# Patient Record
Sex: Female | Born: 1950
Health system: Southern US, Community
[De-identification: ages and names within clinical notes are randomized; demographics above are authoritative.]

## PROBLEM LIST (undated history)

## (undated) DIAGNOSIS — N200 Calculus of kidney: Secondary | ICD-10-CM

## (undated) DIAGNOSIS — N183 Chronic kidney disease, stage 3 unspecified: Secondary | ICD-10-CM

## (undated) DIAGNOSIS — Z87442 Personal history of urinary calculi: Secondary | ICD-10-CM

## (undated) DIAGNOSIS — M1990 Unspecified osteoarthritis, unspecified site: Secondary | ICD-10-CM

## (undated) DIAGNOSIS — I471 Supraventricular tachycardia, unspecified: Secondary | ICD-10-CM

## (undated) DIAGNOSIS — Z8709 Personal history of other diseases of the respiratory system: Secondary | ICD-10-CM

## (undated) DIAGNOSIS — M549 Dorsalgia, unspecified: Secondary | ICD-10-CM

## (undated) DIAGNOSIS — H409 Unspecified glaucoma: Secondary | ICD-10-CM

## (undated) DIAGNOSIS — J45909 Unspecified asthma, uncomplicated: Secondary | ICD-10-CM

## (undated) DIAGNOSIS — N201 Calculus of ureter: Secondary | ICD-10-CM

## (undated) DIAGNOSIS — E119 Type 2 diabetes mellitus without complications: Secondary | ICD-10-CM

## (undated) DIAGNOSIS — R002 Palpitations: Secondary | ICD-10-CM

## (undated) DIAGNOSIS — E785 Hyperlipidemia, unspecified: Secondary | ICD-10-CM

## (undated) DIAGNOSIS — Z973 Presence of spectacles and contact lenses: Secondary | ICD-10-CM

## (undated) DIAGNOSIS — E559 Vitamin D deficiency, unspecified: Secondary | ICD-10-CM

## (undated) DIAGNOSIS — D509 Iron deficiency anemia, unspecified: Secondary | ICD-10-CM

## (undated) HISTORY — DX: Hyperlipidemia, unspecified: E78.5

## (undated) HISTORY — DX: Palpitations: R00.2

## (undated) HISTORY — PX: OTHER SURGICAL HISTORY: SHX169

## (undated) HISTORY — DX: Type 2 diabetes mellitus without complications: E11.9

## (undated) HISTORY — PX: CATARACT EXTRACTION W/ INTRAOCULAR LENS IMPLANT: SHX1309

## (undated) HISTORY — PX: CHOLECYSTECTOMY: SHX55

## (undated) HISTORY — DX: Vitamin D deficiency, unspecified: E55.9

## (undated) HISTORY — DX: Unspecified asthma, uncomplicated: J45.909

## (undated) HISTORY — DX: Unspecified glaucoma: H40.9

## (undated) HISTORY — DX: Dorsalgia, unspecified: M54.9

## (undated) HISTORY — DX: Unspecified osteoarthritis, unspecified site: M19.90

---

## 1997-10-16 HISTORY — PX: CHOLECYSTECTOMY, LAPAROSCOPIC: SHX56

## 1997-10-16 HISTORY — PX: CHOLECYSTECTOMY OPEN: SUR202

## 1998-06-07 ENCOUNTER — Observation Stay (HOSPITAL_COMMUNITY): Admission: RE | Admit: 1998-06-07 | Discharge: 1998-06-08 | Payer: Self-pay | Admitting: General Surgery

## 1999-05-31 ENCOUNTER — Other Ambulatory Visit: Admission: RE | Admit: 1999-05-31 | Discharge: 1999-05-31 | Payer: Self-pay | Admitting: *Deleted

## 2000-03-30 ENCOUNTER — Encounter: Payer: Self-pay | Admitting: *Deleted

## 2000-03-30 ENCOUNTER — Encounter: Admission: RE | Admit: 2000-03-30 | Discharge: 2000-03-30 | Payer: Self-pay | Admitting: *Deleted

## 2001-05-21 ENCOUNTER — Encounter: Admission: RE | Admit: 2001-05-21 | Discharge: 2001-05-21 | Payer: Self-pay | Admitting: *Deleted

## 2001-05-21 ENCOUNTER — Encounter: Payer: Self-pay | Admitting: *Deleted

## 2002-05-27 ENCOUNTER — Encounter: Admission: RE | Admit: 2002-05-27 | Discharge: 2002-05-27 | Payer: Self-pay | Admitting: *Deleted

## 2002-05-27 ENCOUNTER — Encounter: Payer: Self-pay | Admitting: *Deleted

## 2003-09-09 ENCOUNTER — Encounter: Admission: RE | Admit: 2003-09-09 | Discharge: 2003-12-08 | Payer: Self-pay | Admitting: *Deleted

## 2004-02-13 ENCOUNTER — Encounter: Admission: RE | Admit: 2004-02-13 | Discharge: 2004-02-13 | Payer: Self-pay | Admitting: *Deleted

## 2005-05-31 ENCOUNTER — Encounter: Admission: RE | Admit: 2005-05-31 | Discharge: 2005-05-31 | Payer: Self-pay | Admitting: *Deleted

## 2005-06-07 ENCOUNTER — Other Ambulatory Visit: Admission: RE | Admit: 2005-06-07 | Discharge: 2005-06-07 | Payer: Self-pay | Admitting: *Deleted

## 2006-07-18 ENCOUNTER — Encounter: Admission: RE | Admit: 2006-07-18 | Discharge: 2006-07-18 | Payer: Self-pay | Admitting: *Deleted

## 2006-09-07 ENCOUNTER — Other Ambulatory Visit: Admission: RE | Admit: 2006-09-07 | Discharge: 2006-09-07 | Payer: Self-pay | Admitting: *Deleted

## 2007-07-29 ENCOUNTER — Encounter: Admission: RE | Admit: 2007-07-29 | Discharge: 2007-07-29 | Payer: Self-pay | Admitting: *Deleted

## 2007-09-23 ENCOUNTER — Encounter: Admission: RE | Admit: 2007-09-23 | Discharge: 2007-09-23 | Payer: Self-pay | Admitting: Family Medicine

## 2007-11-14 ENCOUNTER — Emergency Department (HOSPITAL_COMMUNITY): Admission: EM | Admit: 2007-11-14 | Discharge: 2007-11-14 | Payer: Self-pay | Admitting: Emergency Medicine

## 2008-08-19 ENCOUNTER — Encounter: Admission: RE | Admit: 2008-08-19 | Discharge: 2008-08-19 | Payer: Self-pay | Admitting: Family Medicine

## 2009-07-27 ENCOUNTER — Other Ambulatory Visit: Admission: RE | Admit: 2009-07-27 | Discharge: 2009-07-27 | Payer: Self-pay | Admitting: Obstetrics and Gynecology

## 2009-08-27 ENCOUNTER — Encounter: Admission: RE | Admit: 2009-08-27 | Discharge: 2009-08-27 | Payer: Self-pay | Admitting: Family Medicine

## 2010-09-13 ENCOUNTER — Encounter: Admission: RE | Admit: 2010-09-13 | Discharge: 2010-09-13 | Payer: Self-pay | Admitting: Family Medicine

## 2010-10-16 HISTORY — PX: CATARACT EXTRACTION W/ INTRAOCULAR LENS IMPLANT: SHX1309

## 2011-04-12 ENCOUNTER — Other Ambulatory Visit: Payer: Self-pay | Admitting: Sports Medicine

## 2011-04-12 DIAGNOSIS — M25512 Pain in left shoulder: Secondary | ICD-10-CM

## 2011-04-14 ENCOUNTER — Other Ambulatory Visit: Payer: Self-pay

## 2011-04-17 ENCOUNTER — Ambulatory Visit
Admission: RE | Admit: 2011-04-17 | Discharge: 2011-04-17 | Disposition: A | Discharge status: Home / Self Care | Payer: Federal, State, Local not specified - PPO | Source: Ambulatory Visit | Attending: Sports Medicine | Admitting: Sports Medicine

## 2011-04-17 DIAGNOSIS — M25512 Pain in left shoulder: Secondary | ICD-10-CM

## 2011-07-06 LAB — BASIC METABOLIC PANEL
CO2: 30
Calcium: 9
Chloride: 103
Creatinine, Ser: 0.62

## 2011-08-21 ENCOUNTER — Other Ambulatory Visit: Payer: Self-pay | Admitting: Family Medicine

## 2011-08-21 DIAGNOSIS — Z1231 Encounter for screening mammogram for malignant neoplasm of breast: Secondary | ICD-10-CM

## 2011-09-20 ENCOUNTER — Ambulatory Visit
Admission: RE | Admit: 2011-09-20 | Discharge: 2011-09-20 | Disposition: A | Discharge status: Home / Self Care | Payer: Federal, State, Local not specified - PPO | Source: Ambulatory Visit | Attending: Family Medicine | Admitting: Family Medicine

## 2011-09-20 DIAGNOSIS — Z1231 Encounter for screening mammogram for malignant neoplasm of breast: Secondary | ICD-10-CM

## 2012-10-11 ENCOUNTER — Other Ambulatory Visit: Payer: Self-pay | Admitting: Family Medicine

## 2012-10-11 DIAGNOSIS — Z1231 Encounter for screening mammogram for malignant neoplasm of breast: Secondary | ICD-10-CM

## 2012-11-04 ENCOUNTER — Ambulatory Visit
Admission: RE | Admit: 2012-11-04 | Discharge: 2012-11-04 | Disposition: A | Discharge status: Home / Self Care | Payer: Federal, State, Local not specified - PPO | Source: Ambulatory Visit | Attending: Family Medicine | Admitting: Family Medicine

## 2012-11-04 DIAGNOSIS — Z1231 Encounter for screening mammogram for malignant neoplasm of breast: Secondary | ICD-10-CM

## 2013-04-21 ENCOUNTER — Telehealth: Payer: Self-pay | Admitting: Cardiovascular Disease

## 2013-04-21 NOTE — Telephone Encounter (Signed)
ROI faxed to Carolanne Grumbling at [phone] 04/21/13/km

## 2013-04-22 ENCOUNTER — Telehealth: Payer: Self-pay | Admitting: Cardiovascular Disease

## 2013-04-22 NOTE — Telephone Encounter (Signed)
Records Rec From Dr.Tracy Turner office gave to [facility] 04/22/13/KM

## 2013-04-25 ENCOUNTER — Encounter: Payer: Self-pay | Admitting: *Deleted

## 2013-04-25 ENCOUNTER — Encounter: Payer: Self-pay | Admitting: Cardiovascular Disease

## 2013-04-25 DIAGNOSIS — E559 Vitamin D deficiency, unspecified: Secondary | ICD-10-CM | POA: Insufficient documentation

## 2013-04-25 DIAGNOSIS — E119 Type 2 diabetes mellitus without complications: Secondary | ICD-10-CM | POA: Insufficient documentation

## 2013-04-25 DIAGNOSIS — R002 Palpitations: Secondary | ICD-10-CM | POA: Insufficient documentation

## 2013-04-25 DIAGNOSIS — H409 Unspecified glaucoma: Secondary | ICD-10-CM | POA: Insufficient documentation

## 2013-04-25 DIAGNOSIS — E785 Hyperlipidemia, unspecified: Secondary | ICD-10-CM | POA: Insufficient documentation

## 2013-04-25 DIAGNOSIS — M1990 Unspecified osteoarthritis, unspecified site: Secondary | ICD-10-CM | POA: Insufficient documentation

## 2013-04-25 DIAGNOSIS — J45909 Unspecified asthma, uncomplicated: Secondary | ICD-10-CM | POA: Insufficient documentation

## 2013-04-25 DIAGNOSIS — M549 Dorsalgia, unspecified: Secondary | ICD-10-CM | POA: Insufficient documentation

## 2013-04-28 ENCOUNTER — Ambulatory Visit (INDEPENDENT_AMBULATORY_CARE_PROVIDER_SITE_OTHER): Payer: Federal, State, Local not specified - PPO | Admitting: Cardiovascular Disease

## 2013-04-28 VITALS — BP 146/80 | HR 75 | Wt 206.0 lb

## 2013-04-28 DIAGNOSIS — E785 Hyperlipidemia, unspecified: Secondary | ICD-10-CM

## 2013-04-28 DIAGNOSIS — E119 Type 2 diabetes mellitus without complications: Secondary | ICD-10-CM

## 2013-04-28 DIAGNOSIS — R002 Palpitations: Secondary | ICD-10-CM

## 2013-04-28 DIAGNOSIS — R079 Chest pain, unspecified: Secondary | ICD-10-CM

## 2013-04-28 NOTE — Assessment & Plan Note (Signed)
Benign reviewed records from Dr Malachy Mood office Observe

## 2013-04-28 NOTE — Patient Instructions (Signed)
Your physician wants you to follow-up in:  6 MONTHS WITH DR NISHAN  You will receive a reminder letter in the mail two months in advance. If you don't receive a letter, please call our office to schedule the follow-up appointment. Your physician recommends that you continue on your current medications as directed. Please refer to the Current Medication list given to you today. 

## 2013-04-28 NOTE — Assessment & Plan Note (Signed)
Atypical Normal myovue 8/13  Tylenol or Motrin PRN no need for further testing.  ECG chronically abnormal Copy given to patient.  No change since myovue done 8/13

## 2013-04-28 NOTE — Assessment & Plan Note (Signed)
Cholesterol is at goal.  Continue current dose of statin and diet Rx.  No myalgias or side effects.  F/U  LFT's in 6 months. No results found for this basename: [facility]  Labs with Dr Zachery Dauer

## 2013-04-28 NOTE — Assessment & Plan Note (Signed)
Discussed low carb diet.  Target hemoglobin A1c is 6.5 or less.  Continue current medications.  

## 2013-04-28 NOTE — Progress Notes (Signed)
Patient ID: Tiffany Weeks, female   DOB: March 25, 1951, 62 y.o.   MRN: [id number] 62 yo referred by Dr Zachery Dauer  Previously seen by Dr Mayford Knife. Palpitations and atypical chest pain.  Had normal myovue 8/13  Event monitor with no arrhythmia.  Her current chest pain dates back to March Her husband has been ill and she leaned over to help him and strained her chest. Pain is non exertional.  Occasionally positional.  No pleuritic component Points to mid sternal area. Palpitatoins are better. Only occasionally at night with flip flops No exertonal symptoms. Getting back to golfing  CRF; DM on oral Rx HTN on ACE and elevated lipids on vytorin  ROS: Denies fever, malais, weight loss, blurry vision, decreased visual acuity, cough, sputum, SOB, hemoptysis, pleuritic pain, palpitaitons, heartburn, abdominal pain, melena, lower extremity edema, claudication, or rash.  All other systems reviewed and negative   General: Affect appropriate Healthy:  appears stated age HEENT: normal Neck supple with no adenopathy JVP normal no bruits no thyromegaly Lungs clear with no wheezing and good diaphragmatic motion Heart:  S1/S2 no murmur,rub, gallop or click PMI normal Abdomen: benighn, BS positve, no tenderness, no AAA no bruit.  No HSM or HJR Distal pulses intact with no bruits No edema Neuro non-focal Skin warm and dry No muscular weakness  Medications Current Outpatient Prescriptions  Medication Sig Dispense Refill  . aspirin 81 MG tablet Take 81 mg by mouth daily.      . bimatoprost (LUMIGAN) 0.03 % ophthalmic solution 1 drop at bedtime.      Marland Kitchen esomeprazole (NEXIUM) 40 MG capsule Take 40 mg by mouth daily before breakfast.      . ezetimibe-simvastatin (VYTORIN) 10-40 MG per tablet Take 1 tablet by mouth at bedtime.      . metFORMIN (GLUCOPHAGE) 500 MG tablet Take 500 mg by mouth 3 (three) times daily.      Marland Kitchen olmesartan (BENICAR) 40 MG tablet Take 20 mg by mouth daily.      . TRADJENTA 5 MG TABS tablet Take 1  tablet by mouth daily.       No current facility-administered medications for this visit.    Allergies Amoxicillin and Naprosyn  Family History: Family History  Problem Relation Age of Onset  . Liver disease    . Aortic stenosis    . Hyperlipidemia    . Thyroid disease    . Breast cancer      Social History: History   Social History  . Marital Status: Single    Spouse Name: N/A    Number of Children: N/A  . Years of Education: N/A   Occupational History  . Not on file.   Social History Main Topics  . Smoking status: Not on file  . Smokeless tobacco: Not on file  . Alcohol Use: Not on file  . Drug Use: Not on file  . Sexually Active: Not on file   Other Topics Concern  . Not on file   Social History Narrative  . No narrative on file    Electrocardiogram:  SR rate 75 LVH ? Lateral infarct LAD  No change from 05/17/11    Assessment and Plan

## 2013-09-10 ENCOUNTER — Other Ambulatory Visit: Payer: Self-pay | Admitting: Obstetrics and Gynecology

## 2013-09-10 ENCOUNTER — Other Ambulatory Visit (HOSPITAL_COMMUNITY)
Admission: RE | Admit: 2013-09-10 | Discharge: 2013-09-10 | Disposition: A | Discharge status: Home / Self Care | Payer: Federal, State, Local not specified - PPO | Source: Ambulatory Visit | Attending: Obstetrics and Gynecology | Admitting: Obstetrics and Gynecology

## 2013-09-10 DIAGNOSIS — Z01419 Encounter for gynecological examination (general) (routine) without abnormal findings: Secondary | ICD-10-CM | POA: Insufficient documentation

## 2013-09-10 DIAGNOSIS — Z1151 Encounter for screening for human papillomavirus (HPV): Secondary | ICD-10-CM | POA: Insufficient documentation

## 2013-10-01 ENCOUNTER — Other Ambulatory Visit: Payer: Self-pay

## 2013-10-01 DIAGNOSIS — Z1231 Encounter for screening mammogram for malignant neoplasm of breast: Secondary | ICD-10-CM

## 2013-10-27 ENCOUNTER — Encounter: Payer: Self-pay | Admitting: Cardiovascular Disease

## 2013-10-27 ENCOUNTER — Ambulatory Visit (INDEPENDENT_AMBULATORY_CARE_PROVIDER_SITE_OTHER): Payer: Federal, State, Local not specified - PPO | Admitting: Cardiovascular Disease

## 2013-10-27 VITALS — BP 175/76 | HR 102 | Ht 63.5 in | Wt 200.8 lb

## 2013-10-27 DIAGNOSIS — R9431 Abnormal electrocardiogram [ECG] [EKG]: Secondary | ICD-10-CM | POA: Insufficient documentation

## 2013-10-27 DIAGNOSIS — R079 Chest pain, unspecified: Secondary | ICD-10-CM

## 2013-10-27 DIAGNOSIS — E119 Type 2 diabetes mellitus without complications: Secondary | ICD-10-CM

## 2013-10-27 DIAGNOSIS — E785 Hyperlipidemia, unspecified: Secondary | ICD-10-CM

## 2013-10-27 DIAGNOSIS — R002 Palpitations: Secondary | ICD-10-CM

## 2013-10-27 DIAGNOSIS — I10 Essential (primary) hypertension: Secondary | ICD-10-CM

## 2013-10-27 NOTE — Assessment & Plan Note (Signed)
Discussed low carb diet.  Target hemoglobin A1c is 6.5 or less.  Continue current medications.  

## 2013-10-27 NOTE — Assessment & Plan Note (Signed)
Cholesterol is at goal.  Continue current dose of statin and diet Rx.  No myalgias or side effects.  F/U  LFT's in 6 months. No results found for this basename: [place] being followed by primary

## 2013-10-27 NOTE — Assessment & Plan Note (Signed)
Resolved atypical Normal myovue 2013  Follow

## 2013-10-27 NOTE — Assessment & Plan Note (Signed)
Resolved related to stress  No need for further w/u

## 2013-10-27 NOTE — Progress Notes (Signed)
Patient ID: Tiffany Weeks, female   DOB: 29-Nov-1950, 63 y.o.   MRN: [id number] 63 yo referred by Dr Tiffany Weeks 2013   Previously seen by Dr Tiffany Weeks. Palpitations and atypical chest pain. Had normal myovue 8/13 Event monitor with no arrhythmia. Her current chest pain dates back to March  she leaned over to help him and strained her chest. Pain is non exertional. Occasionally positional. No pleuritic component Points to mid sternal area. Palpitatoins are better. Only occasionally at night with flip flops No exertonal symptoms. Getting back to golfing CRF; DM on oral Rx HTN on ACE and elevated lipids on vytorin  Husband  Passed and service was Friday  Mixed emotions as he was suffering and a lot of work for her BP seems to be running higher     ROS: Denies fever, malais, weight loss, blurry vision, decreased visual acuity, cough, sputum, SOB, hemoptysis, pleuritic pain, palpitaitons, heartburn, abdominal pain, melena, lower extremity edema, claudication, or rash.  All other systems reviewed and negative  General: Affect appropriate Overweight white female  HEENT: normal Neck supple with no adenopathy JVP normal no bruits no thyromegaly Lungs clear with no wheezing and good diaphragmatic motion Heart:  S1/S2 no murmur, no rub, gallop or click PMI normal Abdomen: benighn, BS positve, no tenderness, no AAA no bruit.  No HSM or HJR Distal pulses intact with no bruits No edema Neuro non-focal Skin warm and dry No muscular weakness   Current Outpatient Prescriptions  Medication Sig Dispense Refill  . aspirin 81 MG tablet Take 81 mg by mouth daily.      . bimatoprost (LUMIGAN) 0.03 % ophthalmic solution 1 drop at bedtime.      . cholecalciferol (VITAMIN D) 1000 UNITS tablet Take 1,000 Units by mouth daily.      Marland Kitchen esomeprazole (NEXIUM) 40 MG capsule Take 40 mg by mouth daily before breakfast.      . ezetimibe-simvastatin (VYTORIN) 10-40 MG per tablet Take 1 tablet by mouth at bedtime.      .  metFORMIN (GLUCOPHAGE) 500 MG tablet Take 500 mg by mouth 3 (three) times daily.      Marland Kitchen olmesartan (BENICAR) 40 MG tablet Take 20 mg by mouth daily.      Vladimir Faster Glycol-Propyl Glycol (SYSTANE OP) Apply to eye daily.      . TRADJENTA 5 MG TABS tablet Take 1 tablet by mouth daily.       No current facility-administered medications for this visit.    Allergies  Align; Amoxicillin; Naprosyn; Tape; and Ciprofloxacin hcl  Electrocardiogram:  7/14  SR rate 75  LVH poor R wave progression   Assessment and Plan

## 2013-10-27 NOTE — Assessment & Plan Note (Signed)
I suspect she will need change in meds.  Showed her how to take BP  She has cuff at home  Will call us in a few weeks and if still running high will d/c benicar and change to Hyzaar 100/12.5

## 2013-10-27 NOTE — Patient Instructions (Signed)
Your physician wants you to follow-up in:   [place] will receive a reminder letter in the mail two months in advance. If you don't receive a letter, please call our office to schedule the follow-up appointment. Your physician recommends that you continue on your current medications as directed. Please refer to the Current Medication list given to you today.  PT  TO CALL BACK  NEXT WITH  B/P READINGS

## 2013-10-27 NOTE — Assessment & Plan Note (Signed)
Chronic related to LVH and body habitus  Patient has a copy of it

## 2013-11-06 ENCOUNTER — Ambulatory Visit
Admission: RE | Admit: 2013-11-06 | Discharge: 2013-11-06 | Disposition: A | Discharge status: Home / Self Care | Payer: Federal, State, Local not specified - PPO | Source: Ambulatory Visit

## 2013-11-06 DIAGNOSIS — Z1231 Encounter for screening mammogram for malignant neoplasm of breast: Secondary | ICD-10-CM

## 2013-12-08 ENCOUNTER — Telehealth: Payer: Self-pay | Admitting: *Deleted

## 2013-12-08 DIAGNOSIS — Z79899 Other long term (current) drug therapy: Secondary | ICD-10-CM

## 2013-12-08 NOTE — Telephone Encounter (Signed)
LEFT MESSAGE FOR PT TO CALL BACK  RE   B/P LOG  PER  DR NISHAN  STOP  BENICAR  AND  START  GEN HYZAAR  100/12.5 MG ./CY

## 2013-12-10 MED ORDER — LOSARTAN POTASSIUM-HCTZ 100-12.5 MG PO TABS: 1.0000 | ORAL_TABLET | Freq: Every day | ORAL | Status: DC

## 2013-12-10 NOTE — Telephone Encounter (Signed)
PT  AWARE  WILL LAB DONE  END   OF  MARCH./CY

## 2013-12-31 ENCOUNTER — Telehealth: Payer: Self-pay | Admitting: Cardiovascular Disease

## 2013-12-31 MED ORDER — LOSARTAN POTASSIUM-HCTZ 100-12.5 MG PO TABS: 1.0000 | ORAL_TABLET | Freq: Every day | ORAL | Status: DC

## 2013-12-31 NOTE — Telephone Encounter (Signed)
PT AWARE REFILL DONE .Tiffany Weeks

## 2013-12-31 NOTE — Telephone Encounter (Signed)
New problem       Pt needs a call back about her Medications please.  Pt reports no side effects from [place].  Pt would like a prescription called in to [facility] 90 day supplie.

## 2014-01-07 ENCOUNTER — Other Ambulatory Visit (INDEPENDENT_AMBULATORY_CARE_PROVIDER_SITE_OTHER): Payer: Federal, State, Local not specified - PPO

## 2014-01-07 DIAGNOSIS — Z79899 Other long term (current) drug therapy: Secondary | ICD-10-CM

## 2014-01-07 LAB — BASIC METABOLIC PANEL
BUN: 21 mg/dL (ref 6–23)
CALCIUM: 9.7 mg/dL (ref 8.4–10.5)
CHLORIDE: 99 meq/L (ref 96–112)
CO2: 30 meq/L (ref 19–32)
Creatinine, Ser: 0.9 mg/dL (ref 0.4–1.2)
GFR: 65.35 mL/min (ref >60.00)
GLUCOSE: 166 mg/dL — AB (ref 70–99)
Potassium: 3.8 mEq/L (ref 3.5–5.1)
SODIUM: 137 meq/L (ref 135–145)

## 2014-01-29 ENCOUNTER — Telehealth: Payer: Self-pay | Admitting: Cardiovascular Disease

## 2014-01-29 NOTE — Telephone Encounter (Signed)
Yes ok to stop hyzaar and increase benicar to 40 mg

## 2014-01-29 NOTE — Telephone Encounter (Signed)
New message     Pt has questions about bp medications and want lab results

## 2014-01-29 NOTE — Telephone Encounter (Signed)
Pt states since she started taking the medication Losartan-hydrochlorothiazide (Hyzaar) 100-12.5 mg one tablet daily, she has been nauseated every time she takes it. Pt would like to know if she can stop taking the Hyzaar medication and increase the  Benicar dose to 40 mg instead. Pt is taking now (20 mg)1/2 tablet Benicar daily . Pt states she  tolerates the benicar well. Pt is aware that this message will send to Md for recommendations.

## 2014-01-30 MED ORDER — OLMESARTAN MEDOXOMIL 40 MG PO TABS: ORAL_TABLET | ORAL | Status: DC

## 2014-01-30 NOTE — Telephone Encounter (Signed)
Called patient and advised per Dr.Nishan that she can stop Hyzaar and increase Benicar to 40mg  every day. She will check her BP a few times per week and call us with any issues.

## 2014-04-21 ENCOUNTER — Other Ambulatory Visit: Payer: Self-pay | Admitting: Gastroenterology

## 2014-06-04 ENCOUNTER — Other Ambulatory Visit (HOSPITAL_COMMUNITY): Payer: Self-pay | Admitting: Gastroenterology

## 2014-06-04 DIAGNOSIS — R140 Abdominal distension (gaseous): Secondary | ICD-10-CM

## 2014-06-09 ENCOUNTER — Ambulatory Visit (HOSPITAL_COMMUNITY)
Admission: RE | Admit: 2014-06-09 | Discharge: 2014-06-09 | Disposition: A | Discharge status: Home / Self Care | Payer: Federal, State, Local not specified - PPO | Source: Ambulatory Visit | Attending: Gastroenterology | Admitting: Gastroenterology

## 2014-06-09 DIAGNOSIS — R143 Flatulence: Principal | ICD-10-CM

## 2014-06-09 DIAGNOSIS — R142 Eructation: Secondary | ICD-10-CM | POA: Diagnosis not present

## 2014-06-09 DIAGNOSIS — R141 Gas pain: Secondary | ICD-10-CM | POA: Insufficient documentation

## 2014-06-09 DIAGNOSIS — R140 Abdominal distension (gaseous): Secondary | ICD-10-CM

## 2014-06-09 MED ORDER — TECHNETIUM TC 99M SULFUR COLLOID
2.0000 | Freq: Once | INTRAVENOUS | Status: AC | PRN
  Administered 2014-06-09: 2 via ORAL

## 2014-07-27 ENCOUNTER — Ambulatory Visit: Payer: Self-pay | Admitting: Podiatry

## 2014-07-29 ENCOUNTER — Ambulatory Visit (INDEPENDENT_AMBULATORY_CARE_PROVIDER_SITE_OTHER): Payer: Federal, State, Local not specified - PPO | Admitting: Podiatry

## 2014-07-29 ENCOUNTER — Encounter: Payer: Self-pay | Admitting: Podiatry

## 2014-07-29 ENCOUNTER — Ambulatory Visit (INDEPENDENT_AMBULATORY_CARE_PROVIDER_SITE_OTHER): Payer: Federal, State, Local not specified - PPO

## 2014-07-29 VITALS — BP 172/86 | HR 83 | Resp 16

## 2014-07-29 DIAGNOSIS — M779 Enthesopathy, unspecified: Secondary | ICD-10-CM

## 2014-07-29 DIAGNOSIS — M7660 Achilles tendinitis, unspecified leg: Secondary | ICD-10-CM

## 2014-07-29 MED ORDER — TRIAMCINOLONE ACETONIDE 10 MG/ML IJ SUSP
10.0000 mg | Freq: Once | INTRAMUSCULAR | Status: AC
Start: 2014-07-29 — End: 2014-07-29
  Administered 2014-07-29: 10 mg

## 2014-07-30 NOTE — Progress Notes (Signed)
Subjective:     Patient ID: Tiffany Weeks, female   DOB: [birth year], 63 y.o.   MRN: [id number]  Foot Pain   patient presents stating I'm getting discomfort around the bottom of my right first MPJ and it's making it hard for me to wear shoe gear comfortably. States that it occurred after activity   Review of Systems  All other systems reviewed and are negative.      Objective:   Physical Exam Neurovascular status intact muscle strength adequate with range of motion within normal limits and noted to have discomfort plantar aspect around the first metatarsal head that makes walking difficult with moderate discomfort around the Achilles tendon of both feet    Assessment:     Probable inflammatory capsulitis right secondary to excessive activity along with chronic tendinitis condition    Plan:     H&P and x-ray reviewed and careful injection administered to the painful area 3 mg Kenalog 5 mg Xylocaine along with padding to off load this painful area. Gave instructions on physical therapy discussed possibilities for orthotics and reappoint her recheck in the next several weeks

## 2014-08-05 ENCOUNTER — Ambulatory Visit (INDEPENDENT_AMBULATORY_CARE_PROVIDER_SITE_OTHER): Payer: Federal, State, Local not specified - PPO | Admitting: Podiatry

## 2014-08-05 ENCOUNTER — Encounter: Payer: Self-pay | Admitting: Podiatry

## 2014-08-05 VITALS — BP 177/78 | HR 86 | Resp 16

## 2014-08-05 DIAGNOSIS — M779 Enthesopathy, unspecified: Secondary | ICD-10-CM

## 2014-08-05 DIAGNOSIS — M7660 Achilles tendinitis, unspecified leg: Secondary | ICD-10-CM

## 2014-08-05 NOTE — Progress Notes (Signed)
Subjective:     Patient ID: Tiffany Weeks, female   DOB: May 15, 1951, 64 y.o.   MRN: [id number]  HPI patient states I'm having a little bit of pain but it is improved on my right foot   Review of Systems     Objective:   Physical Exam Neurovascular status intact with no change in health history and does have discomfort on the right plantar first metatarsal of a moderate nature when palpated    Assessment:     Inflammatory capsulitis with plantar flexed first metatarsal right foot    Plan:     Instructed patient on condition and recommended orthotics to dispense weight off the area. Patient wants orthotics and scanned for custom orthotics to reduce the weightbearing forces against the first metatarsal and will be seen back when ready

## 2014-08-28 ENCOUNTER — Other Ambulatory Visit: Payer: Federal, State, Local not specified - PPO

## 2014-09-04 ENCOUNTER — Other Ambulatory Visit: Payer: Federal, State, Local not specified - PPO

## 2014-09-04 ENCOUNTER — Ambulatory Visit (INDEPENDENT_AMBULATORY_CARE_PROVIDER_SITE_OTHER): Payer: Federal, State, Local not specified - PPO | Admitting: *Deleted

## 2014-09-04 DIAGNOSIS — M779 Enthesopathy, unspecified: Secondary | ICD-10-CM

## 2014-09-04 NOTE — Progress Notes (Signed)
Patient presents to pick up orthotics. Instructions were reviewed and a copy of those instructions were given to the patient. Patient to follow up as needed. 

## 2014-09-04 NOTE — Patient Instructions (Signed)

## 2014-09-14 ENCOUNTER — Other Ambulatory Visit: Payer: Self-pay | Admitting: Obstetrics and Gynecology

## 2014-09-14 ENCOUNTER — Other Ambulatory Visit (HOSPITAL_COMMUNITY)
Admission: RE | Admit: 2014-09-14 | Discharge: 2014-09-14 | Disposition: A | Discharge status: Home / Self Care | Payer: Federal, State, Local not specified - PPO | Source: Ambulatory Visit | Attending: Obstetrics and Gynecology | Admitting: Obstetrics and Gynecology

## 2014-09-14 DIAGNOSIS — N6324 Unspecified lump in the left breast, lower inner quadrant: Secondary | ICD-10-CM

## 2014-09-14 DIAGNOSIS — Z01419 Encounter for gynecological examination (general) (routine) without abnormal findings: Secondary | ICD-10-CM | POA: Insufficient documentation

## 2014-09-15 ENCOUNTER — Encounter: Payer: Federal, State, Local not specified - PPO | Attending: Family Medicine

## 2014-09-15 VITALS — Ht 63.0 in | Wt 199.5 lb

## 2014-09-15 DIAGNOSIS — E119 Type 2 diabetes mellitus without complications: Secondary | ICD-10-CM | POA: Insufficient documentation

## 2014-09-15 DIAGNOSIS — Z713 Dietary counseling and surveillance: Secondary | ICD-10-CM | POA: Insufficient documentation

## 2014-09-15 DIAGNOSIS — E118 Type 2 diabetes mellitus with unspecified complications: Secondary | ICD-10-CM

## 2014-09-15 LAB — CYTOLOGY - PAP

## 2014-09-18 NOTE — Progress Notes (Signed)

## 2014-09-25 ENCOUNTER — Ambulatory Visit
Admission: RE | Admit: 2014-09-25 | Discharge: 2014-09-25 | Disposition: A | Discharge status: Home / Self Care | Payer: Federal, State, Local not specified - PPO | Source: Ambulatory Visit | Attending: Obstetrics and Gynecology | Admitting: Obstetrics and Gynecology

## 2014-09-25 DIAGNOSIS — N6324 Unspecified lump in the left breast, lower inner quadrant: Secondary | ICD-10-CM

## 2014-09-29 DIAGNOSIS — E119 Type 2 diabetes mellitus without complications: Secondary | ICD-10-CM | POA: Diagnosis not present

## 2014-10-02 NOTE — Progress Notes (Signed)
Patient was seen on 09/29/14 for the third of a series of three diabetes self-management courses at the [facility]. The following learning objectives were met by the patient during this class:  . State the amount of activity recommended for healthy living . Describe activities suitable for individual needs . Identify ways to regularly incorporate activity into daily life . Identify barriers to activity and ways to over come these barriers  Identify diabetes medications being personally used and their primary action for lowering glucose and possible side effects . Describe role of stress on blood glucose and develop strategies to address psychosocial issues . Identify diabetes complications and ways to prevent them  Explain how to manage diabetes during illness . Evaluate success in meeting personal goal . Establish 2-3 goals that they will plan to diligently work on until they return for the  4-month follow-up visit  Goals:   I will be active 30 minutes or more 3 times a week  Your patient has identified these potential barriers to change:  Motivation Time  Your patient has identified their diabetes self-care support plan as  Family Support Plan:  Attend Core 4 in 4 months   

## 2014-10-23 ENCOUNTER — Other Ambulatory Visit: Payer: Self-pay

## 2014-10-23 DIAGNOSIS — Z1231 Encounter for screening mammogram for malignant neoplasm of breast: Secondary | ICD-10-CM

## 2014-10-27 NOTE — Progress Notes (Signed)
Patient ID: Tiffany Weeks, female   DOB: 1951/03/07, 64 y.o.   MRN: [id number] 64 y.o.  referred by Dr Drema Dallas 2013   Previously seen by Dr Radford Pax. Palpitations and atypical chest pain. Had normal myovue 8/13 Event monitor with no arrhythmia. Her current chest pain dates back to March  she leaned over to help him and strained her chest. Pain is non exertional. Occasionally positional. No pleuritic component Points to mid sternal area. Palpitatoins are better. Only occasionally at night with flip flops No exertonal symptoms. Getting back to golfing CRF; DM on oral Rx HTN on ACE and elevated lipids on vytorin  Husband  Passed last year   Mixed emotions as he was suffering and a lot of work for her BP seems to be running higher   Last visit tried hyzaar for BP control and patient had nausea  Benicar increased to 40 mg  Review of BP readings still high  She does not want to be on diuretic   Sees Dr Paulita Fujita for GERD EGD and motility study ok  Changing to zantac   ROS: Denies fever, malais, weight loss, blurry vision, decreased visual acuity, cough, sputum, SOB, hemoptysis, pleuritic pain, palpitaitons, heartburn, abdominal pain, melena, lower extremity edema, claudication, or rash.  All other systems reviewed and negative  General: Affect appropriate Overweight white female  HEENT: normal Neck supple with no adenopathy JVP normal no bruits no thyromegaly Lungs clear with no wheezing and good diaphragmatic motion Heart:  S1/S2 no murmur, no rub, gallop or click PMI normal Abdomen: benighn, BS positve, no tenderness, no AAA no bruit.  No HSM or HJR Distal pulses intact with no bruits No edema Neuro non-focal Skin warm and dry No muscular weakness   Current Outpatient Prescriptions  Medication Sig Dispense Refill  . aspirin 81 MG tablet Take 81 mg by mouth daily.    . belladonna-PHENObarbital (DONNATAL) 16.2 MG/5ML ELIX Take 5 mLs by mouth 3 (three) times daily.    . cholecalciferol (VITAMIN  D) 1000 UNITS tablet Take 1,000 Units by mouth daily.    Marland Kitchen esomeprazole (NEXIUM) 40 MG capsule Take 40 mg by mouth daily before breakfast.    . ezetimibe-simvastatin (VYTORIN) 10-40 MG per tablet Take 1 tablet by mouth at bedtime.    . metFORMIN (GLUCOPHAGE) 500 MG tablet Take 500 mg by mouth 3 (three) times daily.    Marland Kitchen olmesartan (BENICAR) 40 MG tablet Take 40mg  every day 90 tablet 3  . Polyethyl Glycol-Propyl Glycol (SYSTANE OP) Apply to eye daily.    . TRADJENTA 5 MG TABS tablet Take 1 tablet by mouth daily.     No current facility-administered medications for this visit.    Allergies  Align; Amoxicillin; Naprosyn; Tape; and Ciprofloxacin hcl  Electrocardiogram:  7/14  SR rate 75  LVH poor R wave progression  10/29/14  Sr rate 80 PVC    Assessment and Plan

## 2014-10-29 ENCOUNTER — Encounter: Payer: Self-pay | Admitting: Cardiovascular Disease

## 2014-10-29 ENCOUNTER — Ambulatory Visit (INDEPENDENT_AMBULATORY_CARE_PROVIDER_SITE_OTHER): Payer: Federal, State, Local not specified - PPO | Admitting: Cardiovascular Disease

## 2014-10-29 VITALS — BP 144/80 | HR 81 | Ht 63.0 in | Wt 195.0 lb

## 2014-10-29 DIAGNOSIS — I10 Essential (primary) hypertension: Secondary | ICD-10-CM

## 2014-10-29 DIAGNOSIS — E1040 Type 1 diabetes mellitus with diabetic neuropathy, unspecified: Secondary | ICD-10-CM

## 2014-10-29 DIAGNOSIS — E785 Hyperlipidemia, unspecified: Secondary | ICD-10-CM

## 2014-10-29 MED ORDER — AMLODIPINE BESYLATE 5 MG PO TABS: 5.0000 mg | ORAL_TABLET | Freq: Every evening | ORAL | Status: DC

## 2014-10-29 MED ORDER — OLMESARTAN MEDOXOMIL 40 MG PO TABS: ORAL_TABLET | ORAL | Status: DC

## 2014-10-29 NOTE — Assessment & Plan Note (Signed)
Cholesterol is at goal.  Continue current dose of statin and diet Rx.  No myalgias or side effects.  F/U  LFT's in 6 months. No results found for: LDLCALC           

## 2014-10-29 NOTE — Assessment & Plan Note (Signed)
Discussed low carb diet.  Target hemoglobin A1c is 6.5 or less.  Continue current medications.  

## 2014-10-29 NOTE — Patient Instructions (Signed)
START NORVASC 5 MG IN THE EVENING   Your physician wants you to follow-up in: 3 [place] will receive a reminder letter in the mail two months in advance. If you don't receive a letter, please call our office to schedule the follow-up appointment.

## 2014-10-29 NOTE — Assessment & Plan Note (Signed)
Suboptimal control.  Add norvasc 5 mg  F/u 3 months  Low sodium diet Take benicar in am and norvasc at supper

## 2014-11-12 ENCOUNTER — Ambulatory Visit
Admission: RE | Admit: 2014-11-12 | Discharge: 2014-11-12 | Disposition: A | Discharge status: Home / Self Care | Payer: Federal, State, Local not specified - PPO | Source: Ambulatory Visit

## 2014-11-12 DIAGNOSIS — Z1231 Encounter for screening mammogram for malignant neoplasm of breast: Secondary | ICD-10-CM

## 2015-01-19 NOTE — Progress Notes (Signed)
Patient ID: Tiffany Weeks, female   DOB: 1951-09-30, 64 y.o.   MRN: [id number] 64 y.o.  referred by Dr Drema Dallas 2013   Previously seen by Dr Radford Pax. Palpitations and atypical chest pain. Had normal myovue 8/13 Event monitor with no arrhythmia. Some left trapezius muscle soreness relieved with bioflex   DM well controlled.  Wanting to get back to water aerobics.    Palpitatoins are better. Only occasionally at night with flip flops No exertonal symptoms. Getting back to golfing CRF; DM on oral Rx HTN on ACE and elevated lipids on vytorin  Husband  Passed last year   Mixed emotions as he was suffering and a lot of work for her BP seems to be running higher   Last visit tried hyzaar for BP control and patient had nausea  Benicar increased to 40 mg   She does not want to be on diuretic  Reviewed home BP readings with norvasc and in normal range     Sees Dr Paulita Fujita for GERD EGD and motility study ok  Changing to zantac   ROS: Denies fever, malais, weight loss, blurry vision, decreased visual acuity, cough, sputum, SOB, hemoptysis, pleuritic pain, palpitaitons, heartburn, abdominal pain, melena, lower extremity edema, claudication, or rash.  All other systems reviewed and negative  General: Affect appropriate Overweight white female  HEENT: normal Neck supple with no adenopathy JVP normal no bruits no thyromegaly Lungs clear with no wheezing and good diaphragmatic motion Heart:  S1/S2 no murmur, no rub, gallop or click PMI normal Abdomen: benighn, BS positve, no tenderness, no AAA no bruit.  No HSM or HJR Distal pulses intact with no bruits No edema Neuro non-focal Skin warm and dry No muscular weakness   Current Outpatient Prescriptions  Medication Sig Dispense Refill  . amLODipine (NORVASC) 5 MG tablet Take 1 tablet (5 mg total) by mouth every evening. 30 tablet 5  . aspirin 81 MG tablet Take 81 mg by mouth daily.    . cholecalciferol (VITAMIN D) 1000 UNITS tablet Take 1,000 Units by  mouth daily.    Marland Kitchen ezetimibe-simvastatin (VYTORIN) 10-40 MG per tablet Take 1 tablet by mouth as directed. 1 TABLET EVERY 3 DAYS    . glimepiride (AMARYL) 1 MG tablet Take 1 tablet by mouth daily  0  . metFORMIN (GLUCOPHAGE) 500 MG tablet 3 (three) times daily. TAKE 1 1/2 tablet by mouth with breakfast,1 1 tablet by mouth in the afternoon and 1 1/2tablet by mouth at bedtime    . olmesartan (BENICAR) 40 MG tablet ONE TABLET EVERY MORNING 30 tablet 5  . Polyethyl Glycol-Propyl Glycol (SYSTANE OP) Take 1 or two drops in eyes once or twice a day    . ranitidine (ZANTAC) 150 MG tablet Take 150 mg by mouth 2 (two) times daily.     . TRADJENTA 5 MG TABS tablet Take 1 tablet by mouth daily.     No current facility-administered medications for this visit.    Allergies  Align; Amoxicillin; Naprosyn; Tape; and Ciprofloxacin hcl  Electrocardiogram:  7/14  SR rate 75  LVH poor R wave progression  10/29/13  SR rate 80 PVC    Assessment and Plan Chest Pain:  Resolved Muscular left shoulder pain.  Consider f/u stress testing next year given DM HTN:  Well controlled continue current meds DM:  Well controlled A1c with primary sees eye doctor and podiatrist regularly Palpitations resolved  No need for beta blocker

## 2015-01-20 ENCOUNTER — Encounter: Payer: Self-pay | Admitting: Cardiovascular Disease

## 2015-01-20 ENCOUNTER — Ambulatory Visit (INDEPENDENT_AMBULATORY_CARE_PROVIDER_SITE_OTHER): Payer: Federal, State, Local not specified - PPO | Admitting: Cardiovascular Disease

## 2015-01-20 VITALS — BP 148/60 | HR 85 | Ht 63.0 in | Wt 193.4 lb

## 2015-01-20 DIAGNOSIS — R079 Chest pain, unspecified: Secondary | ICD-10-CM | POA: Diagnosis not present

## 2015-01-20 NOTE — Patient Instructions (Signed)
Your physician wants you to follow-up in: YEAR WITH DR NISHAN  You will receive a reminder letter in the mail two months in advance. If you don't receive a letter, please call our office to schedule the follow-up appointment.  Your physician recommends that you continue on your current medications as directed. Please refer to the Current Medication list given to you today. 

## 2015-02-24 ENCOUNTER — Other Ambulatory Visit: Payer: Self-pay | Admitting: *Deleted

## 2015-02-24 MED ORDER — OLMESARTAN MEDOXOMIL 40 MG PO TABS: ORAL_TABLET | ORAL | Status: DC

## 2015-03-22 ENCOUNTER — Telehealth: Payer: Self-pay | Admitting: Cardiovascular Disease

## 2015-03-22 NOTE — Telephone Encounter (Signed)
New Message      Pt calling stating that her BP has been running low, this morning was BP 91/61 P 88. Pt wants to know if she should take her BP medication or not. Pt also states she has diarrhea and no appetite.  Please call back and advise.

## 2015-03-22 NOTE — Telephone Encounter (Signed)
Spoke  WITH PT  HAS  HAD  STOMACH BUG  FOR   ABOUT  WEEK   THAT  WAS  WHEN  B/P WAS  NOTED . B/P CURRENTLY   HAS BEEN RUNNING  90-92/59-60"S  WITHOUT  AMLODIPINE  OR  BENICAR  WILL CALL BACK  ONCE B/P STARTS TO  INCREASE  RIGHT  NOW PT IS  DRINKING  GATORADE AND  PEDI LITE PT NOT  TAKING MEDS.PT  STATES  STILL HAS  DIARRHEA  .   WILL FORWARD TO DR Johnsie Cancel FOR  REVIEW  .Adonis Housekeeper

## 2015-04-18 ENCOUNTER — Other Ambulatory Visit: Payer: Self-pay | Admitting: Cardiovascular Disease

## 2015-09-15 ENCOUNTER — Other Ambulatory Visit: Payer: Self-pay | Admitting: Obstetrics and Gynecology

## 2015-09-15 ENCOUNTER — Other Ambulatory Visit (HOSPITAL_COMMUNITY)
Admission: RE | Admit: 2015-09-15 | Discharge: 2015-09-15 | Disposition: A | Discharge status: Home / Self Care | Payer: Federal, State, Local not specified - PPO | Source: Ambulatory Visit | Attending: Obstetrics and Gynecology | Admitting: Obstetrics and Gynecology

## 2015-09-15 DIAGNOSIS — Z124 Encounter for screening for malignant neoplasm of cervix: Secondary | ICD-10-CM | POA: Insufficient documentation

## 2015-09-16 LAB — CYTOLOGY - PAP

## 2015-09-21 ENCOUNTER — Other Ambulatory Visit: Payer: Self-pay | Admitting: Gastroenterology

## 2015-11-02 ENCOUNTER — Other Ambulatory Visit: Payer: Self-pay

## 2015-11-02 DIAGNOSIS — Z1231 Encounter for screening mammogram for malignant neoplasm of breast: Secondary | ICD-10-CM

## 2015-11-09 ENCOUNTER — Other Ambulatory Visit: Payer: Self-pay | Admitting: Cardiovascular Disease

## 2015-11-17 ENCOUNTER — Ambulatory Visit
Admission: RE | Admit: 2015-11-17 | Discharge: 2015-11-17 | Disposition: A | Discharge status: Home / Self Care | Payer: Federal, State, Local not specified - PPO | Source: Ambulatory Visit

## 2015-11-17 DIAGNOSIS — Z1231 Encounter for screening mammogram for malignant neoplasm of breast: Secondary | ICD-10-CM

## 2016-01-31 NOTE — Progress Notes (Signed)
Patient ID: NATALYAH EGELAND, female   DOB: 1951-04-16, 65 y.o.   MRN: [id number] 65 y.o.  referred by Dr Drema Dallas 2013   Previously seen by Dr Radford Pax. Palpitations and atypical chest pain. Had normal myovue 8/13 Event monitor with no arrhythmia. Some left trapezius muscle soreness relieved with bioflex   DM well controlled.  Wanting to get back to water aerobics.    Palpitatoins are better. Only occasionally at night with flip flops No exertonal symptoms. Getting back to golfing CRF; DM on oral Rx HTN on ACE and elevated lipids on vytorin  Husband  Passed 2015   Mixed emotions as he was suffering and a lot of work for her BP seems to be running higher   Last visit tried hyzaar for BP control and patient had nausea  Benicar increased to 40 mg   She does not want to be on diuretic  Reviewed home BP readings with norvasc and in normal range     Sees Dr Paulita Fujita for GERD EGD and motility study ok  Changing to zantac Had black stools occasionally colonoscopy December normal   Has exertional dyspnea and "jabs" of SSCP with activity .  Nothing prolonged Not pleuritic not positional has had for a few months   ROS: Denies fever, malais, weight loss, blurry vision, decreased visual acuity, cough, sputum, SOB, hemoptysis, pleuritic pain, palpitaitons, heartburn, abdominal pain, melena, lower extremity edema, claudication, or rash.  All other systems reviewed and negative  General: Affect appropriate Overweight white female  HEENT: normal Neck supple with no adenopathy JVP normal no bruits no thyromegaly Lungs clear with no wheezing and good diaphragmatic motion Heart:  S1/S2 no murmur, no rub, gallop or click PMI normal Abdomen: benighn, BS positve, no tenderness, no AAA no bruit.  No HSM or HJR Distal pulses intact with no bruits No edema Neuro non-focal Skin warm and dry No muscular weakness   Current Outpatient Prescriptions  Medication Sig Dispense Refill  . amLODipine (NORVASC) 5 MG tablet  TAKE 1 TABLET BY MOUTH EVERY EVENING 30 tablet 2  . aspirin 81 MG tablet Take 81 mg by mouth daily.    . cholecalciferol (VITAMIN D) 1000 UNITS tablet Take 1,000 Units by mouth daily.    Marland Kitchen ezetimibe-simvastatin (VYTORIN) 10-40 MG per tablet Take 1 tablet by mouth as directed.     Marland Kitchen glimepiride (AMARYL) 1 MG tablet Take 1 tablet by mouth daily  0  . meloxicam (MOBIC) 15 MG tablet Take 15 mg by mouth as directed.    . metFORMIN (GLUCOPHAGE) 500 MG tablet Take 750 mg by mouth 2 (two) times daily with a meal.     . olmesartan (BENICAR) 40 MG tablet Take 40 mg by mouth daily.    Vladimir Faster Glycol-Propyl Glycol (SYSTANE OP) Place 1-2 drops into both eyes as directed.     . ranitidine (ZANTAC) 150 MG tablet Take 150 mg by mouth 2 (two) times daily.     . TRADJENTA 5 MG TABS tablet Take 1 tablet by mouth daily.     No current facility-administered medications for this visit.    Allergies  Align; Naprosyn; Amoxicillin; Ciprofloxacin hcl; and Tape  Electrocardiogram:  7/14  SR rate 75  LVH poor R wave progression  10/29/13  SR rate 80 PVC    02/01/16  SR rate 82  Poor R wave progression   Assessment and Plan Chest Pain:  Atypical but diabetic and ECG ? Anterior MI  F/u exercise myovue  HTN:  Well controlled continue current meds home readings reviewed and fine  DM:  Well controlled A1c with primary sees eye doctor and podiatrist regularly A1c with Dr Drema Dallas 5.9 Palpitations resolved  No need for beta blocker  Chol:  LDL under 100 takes vytorin 3x/week  GI:  Told to stop ASA if stools "black" and call Dr August Saucer

## 2016-02-01 ENCOUNTER — Encounter: Payer: Self-pay | Admitting: Cardiovascular Disease

## 2016-02-01 ENCOUNTER — Ambulatory Visit (INDEPENDENT_AMBULATORY_CARE_PROVIDER_SITE_OTHER): Payer: Federal, State, Local not specified - PPO | Admitting: Cardiovascular Disease

## 2016-02-01 ENCOUNTER — Other Ambulatory Visit: Payer: Self-pay | Admitting: Cardiovascular Disease

## 2016-02-01 VITALS — BP 150/82 | HR 90 | Ht 63.0 in | Wt 201.1 lb

## 2016-02-01 DIAGNOSIS — I10 Essential (primary) hypertension: Secondary | ICD-10-CM

## 2016-02-01 DIAGNOSIS — R0789 Other chest pain: Secondary | ICD-10-CM | POA: Diagnosis not present

## 2016-02-01 MED ORDER — OLMESARTAN MEDOXOMIL 40 MG PO TABS: 40.0000 mg | ORAL_TABLET | Freq: Every day | ORAL | Status: DC

## 2016-02-01 NOTE — Patient Instructions (Signed)
Medication Instructions:  Your physician recommends that you continue on your current medications as directed. Please refer to the Current Medication list given to you today.   Labwork: None ordered  Testing/Procedures: Your physician has requested that you have en exercise stress myoview. For further information please visit [email or web address]. Please follow instruction sheet, as given.   Follow-Up: Your physician wants you to follow-up in: 1 year with Dr.Nishan You will receive a reminder letter in the mail two months in advance. If you don't receive a letter, please call our office to schedule the follow-up appointment.   Any Other Special Instructions Will Be Listed Below (If Applicable).     If you need a refill on your cardiac medications before your next appointment, please call your pharmacy.

## 2016-02-02 ENCOUNTER — Telehealth (HOSPITAL_COMMUNITY): Payer: Self-pay | Admitting: *Deleted

## 2016-02-02 NOTE — Telephone Encounter (Signed)
Patient given detailed instructions per Myocardial Perfusion Study Information Sheet for the test on 02/08/16. Patient notified to arrive 15 minutes early and that it is imperative to arrive on time for appointment to keep from having the test rescheduled.  If you need to cancel or reschedule your appointment, please call the office within 24 hours of your appointment. Failure to do so may result in a cancellation of your appointment, and a $50 no show fee. Patient verbalized understanding.[place] J Willie Loy, RN   

## 2016-02-08 ENCOUNTER — Ambulatory Visit (HOSPITAL_COMMUNITY): Payer: Federal, State, Local not specified - PPO | Attending: Cardiology

## 2016-02-08 DIAGNOSIS — E119 Type 2 diabetes mellitus without complications: Secondary | ICD-10-CM | POA: Diagnosis not present

## 2016-02-08 DIAGNOSIS — R0789 Other chest pain: Secondary | ICD-10-CM | POA: Diagnosis not present

## 2016-02-08 DIAGNOSIS — R002 Palpitations: Secondary | ICD-10-CM | POA: Diagnosis not present

## 2016-02-08 DIAGNOSIS — I10 Essential (primary) hypertension: Secondary | ICD-10-CM | POA: Insufficient documentation

## 2016-02-08 DIAGNOSIS — R0609 Other forms of dyspnea: Secondary | ICD-10-CM | POA: Diagnosis not present

## 2016-02-08 MED ORDER — TECHNETIUM TC 99M SESTAMIBI GENERIC - CARDIOLITE
32.7000 | Freq: Once | INTRAVENOUS | Status: AC | PRN
  Administered 2016-02-08: 32.7 via INTRAVENOUS

## 2016-02-09 ENCOUNTER — Ambulatory Visit (HOSPITAL_COMMUNITY): Payer: Federal, State, Local not specified - PPO | Attending: Cardiovascular Disease

## 2016-02-09 LAB — MYOCARDIAL PERFUSION IMAGING
CHL CUP MPHR: 155 {beats}/min
CHL CUP NUCLEAR SRS: 1
CHL CUP RESTING HR STRESS: 78 {beats}/min
CSEPHR: 105 %
Estimated workload: 7 METS
Exercise duration (min): 6 min
Exercise duration (sec): 0 s
LVDIAVOL: 69 mL (ref 46–106)
LVSYSVOL: 26 mL
NUC STRESS TID: 1.02
Peak HR: 164 {beats}/min
RATE: 0.33
SDS: 5
SSS: 6

## 2016-02-09 MED ORDER — TECHNETIUM TC 99M SESTAMIBI GENERIC - CARDIOLITE
32.1000 | Freq: Once | INTRAVENOUS | Status: AC | PRN
  Administered 2016-02-09: 32.1 via INTRAVENOUS

## 2016-04-03 DIAGNOSIS — M19041 Primary osteoarthritis, right hand: Secondary | ICD-10-CM | POA: Insufficient documentation

## 2016-04-03 DIAGNOSIS — R52 Pain, unspecified: Secondary | ICD-10-CM | POA: Insufficient documentation

## 2016-09-13 ENCOUNTER — Encounter: Payer: Self-pay | Admitting: Cardiovascular Disease

## 2016-10-03 ENCOUNTER — Other Ambulatory Visit: Payer: Self-pay | Admitting: Obstetrics and Gynecology

## 2016-10-03 ENCOUNTER — Other Ambulatory Visit (HOSPITAL_COMMUNITY)
Admission: RE | Admit: 2016-10-03 | Discharge: 2016-10-03 | Disposition: A | Discharge status: Home / Self Care | Payer: Federal, State, Local not specified - PPO | Source: Ambulatory Visit | Attending: Obstetrics and Gynecology | Admitting: Obstetrics and Gynecology

## 2016-10-03 DIAGNOSIS — Z01419 Encounter for gynecological examination (general) (routine) without abnormal findings: Secondary | ICD-10-CM | POA: Diagnosis not present

## 2016-10-05 LAB — CYTOLOGY - PAP: DIAGNOSIS: NEGATIVE

## 2016-12-06 ENCOUNTER — Other Ambulatory Visit: Payer: Self-pay | Admitting: Family Medicine

## 2016-12-06 DIAGNOSIS — Z1231 Encounter for screening mammogram for malignant neoplasm of breast: Secondary | ICD-10-CM

## 2016-12-27 ENCOUNTER — Ambulatory Visit
Admission: RE | Admit: 2016-12-27 | Discharge: 2016-12-27 | Disposition: A | Discharge status: Home / Self Care | Payer: Federal, State, Local not specified - PPO | Source: Ambulatory Visit | Attending: Family Medicine | Admitting: Family Medicine

## 2016-12-27 DIAGNOSIS — Z1231 Encounter for screening mammogram for malignant neoplasm of breast: Secondary | ICD-10-CM

## 2017-01-18 ENCOUNTER — Encounter: Payer: Self-pay | Admitting: Cardiovascular Disease

## 2017-01-19 ENCOUNTER — Other Ambulatory Visit: Payer: Self-pay | Admitting: Cardiovascular Disease

## 2017-02-02 NOTE — Progress Notes (Signed)
Patient ID: Tiffany Weeks, female   DOB: 05-Dec-1950, 66 y.o.   MRN: [id number] 66 y.o.  referred by Dr Drema Dallas 2013   Previously seen by Dr Radford Pax. Palpitations and atypical chest pain. Had normal myovue 8/13 and 02/08/16  Event monitor with no arrhythmia. Some left trapezius muscle soreness relieved with bioflex   DM well controlled.  Wanting to get back to water aerobics.    Palpitatoins are better. Only occasionally at night with flip flops No exertonal symptoms. Getting back to golfing CRF; DM on oral Rx HTN on ACE and elevated lipids on vytorin  Husband  Passed 2015   Mixed emotions as he was suffering and a lot of work for her BP seems to be running higher    Reviewed home BP readings with norvasc and in normal range Gets nausea with diuretic     Sees Dr Paulita Fujita for GERD EGD and motility study ok  Changing to zantac Had black stools occasionally colonoscopy December normal     ROS: Denies fever, malais, weight loss, blurry vision, decreased visual acuity, cough, sputum, SOB, hemoptysis, pleuritic pain, palpitaitons, heartburn, abdominal pain, melena, lower extremity edema, claudication, or rash.  All other systems reviewed and negative  General: Affect appropriate Overweight white female  HEENT: normal Neck supple with no adenopathy JVP normal no bruits no thyromegaly Lungs clear with no wheezing and good diaphragmatic motion Heart:  S1/S2 no murmur, no rub, gallop or click PMI normal Abdomen: benighn, BS positve, no tenderness, no AAA no bruit.  No HSM or HJR Distal pulses intact with no bruits No edema Neuro non-focal Skin warm and dry No muscular weakness   Current Outpatient Prescriptions  Medication Sig Dispense Refill  . amLODipine (NORVASC) 5 MG tablet TAKE 1 TABLET BY MOUTH EVERY EVENING 30 tablet 2  . aspirin 81 MG tablet Take 81 mg by mouth daily.    . cholecalciferol (VITAMIN D) 1000 UNITS tablet Take 1,000 Units by mouth daily.    Marland Kitchen ezetimibe-simvastatin  (VYTORIN) 10-40 MG per tablet Take 1 tablet by mouth as directed.     Marland Kitchen glimepiride (AMARYL) 1 MG tablet Take 1 tablet by mouth daily  0  . metFORMIN (GLUCOPHAGE) 500 MG tablet Take 750 mg by mouth 2 (two) times daily with a meal.     . olmesartan (BENICAR) 40 MG tablet Take 1 tablet (40 mg total) by mouth daily. 90 tablet 3  . Polyethyl Glycol-Propyl Glycol (SYSTANE OP) Place 1-2 drops into both eyes as directed.     . ranitidine (ZANTAC) 150 MG tablet Take 150 mg by mouth 2 (two) times daily.     . TRADJENTA 5 MG TABS tablet Take 1 tablet by mouth daily.     No current facility-administered medications for this visit.     Allergies  Align [acidophilus]; Bifidobacterium; Losartan; Naprosyn [naproxen]; Amoxicillin; Ciprofloxacin hcl; and Tape  Electrocardiogram:  7/14  SR rate 75  LVH poor R wave progression  10/29/13  SR rate 80 PVC    02/01/16  SR rate 82  Poor R wave progression   Assessment and Plan Chest Pain:  Atypical  myovue 02/09/16 normal EF 63%  HTN:  Well controlled continue current meds home readings reviewed and fine  DM:  Well controlled A1c with primary sees eye doctor and podiatrist regularly A1c with Dr Drema Dallas 5.9 Palpitations resolved  No need for beta blocker  Chol:  LDL under 100 takes vytorin 3x/week  GI:  Told to stop ASA if stools "  black" and call Dr August Saucer

## 2017-02-05 ENCOUNTER — Ambulatory Visit (INDEPENDENT_AMBULATORY_CARE_PROVIDER_SITE_OTHER): Payer: Federal, State, Local not specified - PPO | Admitting: Cardiovascular Disease

## 2017-02-05 ENCOUNTER — Encounter: Payer: Self-pay | Admitting: Cardiovascular Disease

## 2017-02-05 VITALS — BP 136/60 | HR 86 | Ht 66.0 in | Wt 202.4 lb

## 2017-02-05 DIAGNOSIS — I10 Essential (primary) hypertension: Secondary | ICD-10-CM

## 2017-02-05 NOTE — Patient Instructions (Addendum)

## 2017-02-23 ENCOUNTER — Other Ambulatory Visit: Payer: Self-pay | Admitting: Cardiovascular Disease

## 2017-04-13 ENCOUNTER — Other Ambulatory Visit: Payer: Self-pay | Admitting: Cardiovascular Disease

## 2017-10-25 ENCOUNTER — Ambulatory Visit: Payer: Federal, State, Local not specified - PPO | Admitting: Podiatry

## 2017-10-25 ENCOUNTER — Other Ambulatory Visit: Payer: Self-pay | Admitting: Podiatry

## 2017-10-25 ENCOUNTER — Ambulatory Visit (INDEPENDENT_AMBULATORY_CARE_PROVIDER_SITE_OTHER): Payer: Federal, State, Local not specified - PPO

## 2017-10-25 ENCOUNTER — Encounter: Payer: Self-pay | Admitting: Podiatry

## 2017-10-25 DIAGNOSIS — M779 Enthesopathy, unspecified: Secondary | ICD-10-CM

## 2017-10-25 DIAGNOSIS — M25572 Pain in left ankle and joints of left foot: Secondary | ICD-10-CM

## 2017-10-25 DIAGNOSIS — M7662 Achilles tendinitis, left leg: Secondary | ICD-10-CM

## 2017-10-25 MED ORDER — TRIAMCINOLONE ACETONIDE 10 MG/ML IJ SUSP
10.0000 mg | Freq: Once | INTRAMUSCULAR | Status: AC
  Administered 2017-10-25: 10 mg

## 2017-10-25 NOTE — Patient Instructions (Signed)

## 2017-10-25 NOTE — Progress Notes (Signed)
Subjective:   Patient ID: Tiffany Weeks, female   DOB: 67 y.o.   MRN: [id number]   HPI Patient presents with severe discomfort in the back of the left heel stating is been hurting for several months and does not remember injury.  She is tried elevation and ice and patient currently does not smoke and likes to be active   Review of Systems  All other systems reviewed and are negative.       Objective:  Physical Exam  Constitutional: She appears well-developed and well-nourished.  Cardiovascular: Intact distal pulses.  Pulmonary/Chest: Effort normal.  Musculoskeletal: Normal range of motion.  Neurological: She is alert.  Skin: Skin is warm.  Nursing note and vitals reviewed.   Neurovascular status found to be intact muscle strength adequate range of motion within normal limits with exquisite discomfort in the left posterior lateral aspect of the heel with inflammation fluid around the insertion of the Achilles tendon.  Patient is noted to have good digital perfusion and is well oriented x3 with mild equinus condition     Assessment:  Acute Achilles tendinitis left lateral side     Plan:  H&P condition reviewed and discussed careful injection discussing possibilities for rupture.  Patient wants this done I have also recommended immobilization I carefully injected the lateral side 3 mg Dexasone Kenalog and applied air fracture walker to completely immobilize.  Advised on ice and reappoint 3 weeks for reevaluation  X-rays indicate severe thick spur formation of the posterior aspect of the left heel

## 2017-11-19 ENCOUNTER — Ambulatory Visit: Payer: Federal, State, Local not specified - PPO | Admitting: Podiatry

## 2017-11-19 ENCOUNTER — Encounter: Payer: Self-pay | Admitting: Podiatry

## 2017-11-19 DIAGNOSIS — M7662 Achilles tendinitis, left leg: Secondary | ICD-10-CM | POA: Diagnosis not present

## 2017-11-21 NOTE — Progress Notes (Signed)
Subjective:   Patient ID: Tiffany Weeks, female   DOB: 67 y.o.   MRN: [id number]   HPI Patient presents stating the heel is doing a lot better with minimal discomfort and able to walk without pain   ROS      Objective:  Physical Exam  Neurovascular status intact with inflammation of the posterior heel left that is improved quite dramatically with minimal discomfort with palpation     Assessment:  Doing well post Achilles tendon inflammation left     Plan:  H&P conditions reviewed and at this time I recommended physical therapy and stretching exercises and dispensed a silicone ankle compression stocking to reduce posterior pain.  Reappoint as symptoms indicate

## 2017-11-27 ENCOUNTER — Other Ambulatory Visit: Payer: Self-pay | Admitting: Family Medicine

## 2017-11-27 DIAGNOSIS — Z1231 Encounter for screening mammogram for malignant neoplasm of breast: Secondary | ICD-10-CM

## 2017-12-31 ENCOUNTER — Ambulatory Visit
Admission: RE | Admit: 2017-12-31 | Discharge: 2017-12-31 | Disposition: A | Discharge status: Home / Self Care | Payer: Federal, State, Local not specified - PPO | Source: Ambulatory Visit | Attending: Family Medicine | Admitting: Family Medicine

## 2017-12-31 DIAGNOSIS — Z1231 Encounter for screening mammogram for malignant neoplasm of breast: Secondary | ICD-10-CM

## 2018-01-02 ENCOUNTER — Ambulatory Visit (INDEPENDENT_AMBULATORY_CARE_PROVIDER_SITE_OTHER): Payer: Medicare Other | Admitting: Podiatry

## 2018-01-02 ENCOUNTER — Encounter: Payer: Self-pay | Admitting: Podiatry

## 2018-01-02 DIAGNOSIS — M25572 Pain in left ankle and joints of left foot: Secondary | ICD-10-CM | POA: Diagnosis not present

## 2018-01-02 DIAGNOSIS — M7662 Achilles tendinitis, left leg: Secondary | ICD-10-CM | POA: Diagnosis not present

## 2018-01-02 NOTE — Progress Notes (Signed)
Subjective:   Patient ID: Tiffany Weeks, female   DOB: 67 y.o.   MRN: [id number]   HPI Patient states the pain has come back quite intensely in the left posterior heel and she thinks she needs something more aggressive done with it   ROS      Objective:  Physical Exam  Neurovascular status intact with exquisite discomfort posterior lateral aspect left heel at the insertion of the tendon into the calcaneus with inflammation fluid buildup noted and slight abrasion which may have been from the boot or other modality     Assessment:  Probability that this is a chronic acute inflammation of the Achilles tendon left lateral side     Plan:  I reviewed condition and I do not recommend further injections due to the fact we have done those in the past and I do think it would be best to go ahead and utilize shockwave therapy.  I educated her on shockwave and I applied padding to the back of the heel and I want her to use that for the next couple weeks and she will have shockwave performed in the next 2 weeks and I did discuss procedure and risk

## 2018-01-06 ENCOUNTER — Other Ambulatory Visit: Payer: Self-pay | Admitting: Cardiovascular Disease

## 2018-01-07 DIAGNOSIS — E119 Type 2 diabetes mellitus without complications: Secondary | ICD-10-CM | POA: Diagnosis not present

## 2018-01-07 DIAGNOSIS — H40013 Open angle with borderline findings, low risk, bilateral: Secondary | ICD-10-CM | POA: Diagnosis not present

## 2018-01-07 DIAGNOSIS — H1859 Other hereditary corneal dystrophies: Secondary | ICD-10-CM | POA: Diagnosis not present

## 2018-01-16 ENCOUNTER — Other Ambulatory Visit: Payer: Federal, State, Local not specified - PPO

## 2018-01-22 ENCOUNTER — Ambulatory Visit: Payer: Self-pay

## 2018-01-22 ENCOUNTER — Encounter: Payer: Self-pay | Admitting: Cardiovascular Disease

## 2018-01-22 DIAGNOSIS — M79676 Pain in unspecified toe(s): Secondary | ICD-10-CM

## 2018-01-22 DIAGNOSIS — M7662 Achilles tendinitis, left leg: Secondary | ICD-10-CM

## 2018-01-28 NOTE — Progress Notes (Signed)
Patient presents with ongoing pain in her lt heel. She said she has tried various treatments to relieve pain but nothing has helped.   Tenderness on palpation to Lt posterior heel  ESWT administered to area for 3 joules and tolerated well. Advised on avoidance of NSAIDs and ice. She is to follow up in 1 week for 2nd treatment

## 2018-01-29 ENCOUNTER — Ambulatory Visit: Payer: Self-pay

## 2018-01-29 DIAGNOSIS — M79676 Pain in unspecified toe(s): Secondary | ICD-10-CM

## 2018-01-29 DIAGNOSIS — M7662 Achilles tendinitis, left leg: Secondary | ICD-10-CM

## 2018-02-01 ENCOUNTER — Other Ambulatory Visit: Payer: Self-pay | Admitting: Cardiovascular Disease

## 2018-02-04 NOTE — Progress Notes (Signed)
Patient presents with ongoing pain in her lt heel. She said she has tried various treatments to relieve pain but nothing has helped.   Tenderness on palpation to Lt posterior heel  ESWT administered to area for 3.5 joules and tolerated well. Advised on avoidance of NSAIDs and ice. She is to follow up in 1 week for 3rd treatment

## 2018-02-12 DIAGNOSIS — W57XXXA Bitten or stung by nonvenomous insect and other nonvenomous arthropods, initial encounter: Secondary | ICD-10-CM | POA: Diagnosis not present

## 2018-02-12 DIAGNOSIS — S40262A Insect bite (nonvenomous) of left shoulder, initial encounter: Secondary | ICD-10-CM | POA: Diagnosis not present

## 2018-02-13 ENCOUNTER — Ambulatory Visit: Payer: Federal, State, Local not specified - PPO

## 2018-02-13 ENCOUNTER — Ambulatory Visit: Payer: Federal, State, Local not specified - PPO | Admitting: Cardiovascular Disease

## 2018-02-13 DIAGNOSIS — M7662 Achilles tendinitis, left leg: Secondary | ICD-10-CM

## 2018-02-13 DIAGNOSIS — M79676 Pain in unspecified toe(s): Secondary | ICD-10-CM

## 2018-02-14 ENCOUNTER — Other Ambulatory Visit: Payer: Self-pay | Admitting: Cardiovascular Disease

## 2018-02-14 ENCOUNTER — Telehealth: Payer: Self-pay | Admitting: Cardiovascular Disease

## 2018-02-14 MED ORDER — OLMESARTAN MEDOXOMIL 40 MG PO TABS: 40.0000 mg | ORAL_TABLET | Freq: Every day | ORAL | 0 refills | Status: DC

## 2018-02-14 NOTE — Telephone Encounter (Signed)
[facility] pharmacy faxed over an alternative therapy request for Olmesartan 40 mg. Pharmacy stated that Olmesartan 40 mg tablet is mfr backordered, this medication is not available. Please provide a new Rx, FYI all strengths of Losartan are available. Please address

## 2018-02-14 NOTE — Telephone Encounter (Signed)
Cozaar 50 mg ok to substitute

## 2018-02-15 MED ORDER — OLMESARTAN MEDOXOMIL 40 MG PO TABS: 40.0000 mg | ORAL_TABLET | Freq: Every day | ORAL | 3 refills | Status: AC

## 2018-02-15 NOTE — Telephone Encounter (Signed)
Called patient about Olmesartan (Benicar) backorder. Patient has an allergy to Losartan, patient stated she gets nauseated when she takes Losartan. Patient requested to see if local pharmacy had Olmesartan. [facility] [place], [facility]. Patient will pick up [facility].

## 2018-02-18 NOTE — Progress Notes (Signed)
Patient ID: Tiffany Weeks, female   DOB: 09-18-1951, 67 y.o.   MRN: [id number]   66 y.o. history of atypical chest pain and palpitations. Normal myovue in 2013 and 2017. Event monitor with no arrhythmias. CRF;s HTN, HLD and DM-2 Has had nausea with diuretic and cozaar in past. Widowed in 2015     Sees Dr Paulita Fujita for GERD EGD and motility study ok  Changing to zantac Had black stools occasionally colonoscopy December normal   Having chronic left heal pain Received ESWT Rx 01/29/18   Main complaint is fatigue A1c over 7 and has too many carbs Does swim at [facility] 2x/week encouraged her to increase this   ROS: Denies fever, malais, weight loss, blurry vision, decreased visual acuity, cough, sputum, SOB, hemoptysis, pleuritic pain, palpitaitons, heartburn, abdominal pain, melena, lower extremity edema, claudication, or rash.  All other systems reviewed and negative  General: BP 140/80   Pulse 81   Ht 5\' 6"  (1.676 m)   Wt 199 lb 12 oz (90.6 kg)   SpO2 98%   BMI 32.24 kg/m  Affect appropriate Overweight white female  HEENT: normal Neck supple with no adenopathy JVP normal no bruits no thyromegaly Lungs clear with no wheezing and good diaphragmatic motion Heart:  S1/S2 no murmur, no rub, gallop or click PMI normal Abdomen: benighn, BS positve, no tenderness, no AAA no bruit.  No HSM or HJR Distal pulses intact with no bruits No edema Neuro non-focal Skin warm and dry No muscular weakness    Current Outpatient Medications  Medication Sig Dispense Refill  . amLODipine (NORVASC) 5 MG tablet TAKE 1 TABLET BY MOUTH EVERY EVENING. PLEASE KEEP UPCOMING APPT FOR FUTURE REFILLS. THANK YOU 30 tablet 0  . aspirin 81 MG tablet Take 81 mg by mouth daily.    . cholecalciferol (VITAMIN D) 1000 UNITS tablet Take 1,000 Units by mouth daily.    Marland Kitchen ezetimibe-simvastatin (VYTORIN) 10-40 MG per tablet Take 1 tablet by mouth as directed.     Marland Kitchen glimepiride (AMARYL) 1 MG tablet Take 1 tablet by mouth  daily  0  . metFORMIN (GLUCOPHAGE) 500 MG tablet Take 750 mg by mouth 2 (two) times daily with a meal.     . olmesartan (BENICAR) 40 MG tablet Take 1 tablet (40 mg total) by mouth daily. Please keep upcoming appt for future refills. Thank you 90 tablet 3  . omeprazole (PRILOSEC) 40 MG capsule Take 40 mg by mouth as directed.    Vladimir Faster Glycol-Propyl Glycol (SYSTANE OP) Place 1-2 drops into both eyes as directed.     . TRADJENTA 5 MG TABS tablet Take 1 tablet by mouth daily.     No current facility-administered medications for this visit.     Allergies  Align [acidophilus]; Bifidobacterium; Losartan; Naprosyn [naproxen]; Amoxicillin; Ciprofloxacin hcl; and Tape  Electrocardiogram:  7/14  SR rate 75  LVH poor R wave progression  10/29/13  SR rate 80 PVC    02/01/16  SR rate 82  Poor R wave progression   Assessment and Plan Chest Pain:  Atypical  myovue 02/09/16 normal EF 63%  HTN:  Well controlled continue current meds home readings reviewed and fine  DM:  Well controlled A1c with primary sees eye doctor and podiatrist regularly A1c with Dr Drema Dallas 5.9 Palpitations resolved  No need for beta blocker  Chol:  LDL under 100 takes vytorin 3x/week  GI:  Told to stop ASA if stools "black" and call Dr Paulita Fujita   Ortho:  Heel pain f/u PT/OT had pulsed shockwave Rx 01/29/18    Will see patient as needed BP meds refilled f/u Dr Drema Dallas primary   Jenkins Rouge

## 2018-02-22 ENCOUNTER — Encounter: Payer: Self-pay | Admitting: Cardiovascular Disease

## 2018-02-22 ENCOUNTER — Ambulatory Visit (INDEPENDENT_AMBULATORY_CARE_PROVIDER_SITE_OTHER): Payer: Medicare Other | Admitting: Cardiovascular Disease

## 2018-02-22 VITALS — BP 140/80 | HR 81 | Ht 66.0 in | Wt 199.8 lb

## 2018-02-22 DIAGNOSIS — I10 Essential (primary) hypertension: Secondary | ICD-10-CM

## 2018-02-22 MED ORDER — AMLODIPINE BESYLATE 5 MG PO TABS: ORAL_TABLET | ORAL | 3 refills | Status: DC

## 2018-02-22 NOTE — Patient Instructions (Signed)
Medication Instructions:  Your physician recommends that you continue on your current medications as directed. Please refer to the Current Medication list given to you today.  Labwork: NONE  Testing/Procedures: NONE  Follow-Up: Your physician wants you to follow-up in: as needed with Dr. Johnsie Cancel.   If you need a refill on your cardiac medications before your next appointment, please call your pharmacy.

## 2018-03-04 DIAGNOSIS — M6740 Ganglion, unspecified site: Secondary | ICD-10-CM | POA: Diagnosis not present

## 2018-03-05 NOTE — Progress Notes (Signed)
Patient presents with ongoing pain in her lt heel. She said she has tried various treatments to relieve pain but nothing has helped.   Tenderness on palpation to Lt posterior heel  ESWT administered to area for 4 joules and tolerated well. Advised on avoidance of NSAIDs and ice. She is to follow up in 4 weeks for 4th treatment

## 2018-03-15 ENCOUNTER — Ambulatory Visit (INDEPENDENT_AMBULATORY_CARE_PROVIDER_SITE_OTHER): Payer: Medicare Other | Admitting: Podiatry

## 2018-03-15 ENCOUNTER — Ambulatory Visit: Payer: Federal, State, Local not specified - PPO

## 2018-03-15 ENCOUNTER — Encounter: Payer: Self-pay | Admitting: Podiatry

## 2018-03-15 DIAGNOSIS — M7662 Achilles tendinitis, left leg: Secondary | ICD-10-CM | POA: Diagnosis not present

## 2018-03-18 ENCOUNTER — Other Ambulatory Visit: Payer: Federal, State, Local not specified - PPO

## 2018-03-18 NOTE — Progress Notes (Signed)
Subjective:   Patient ID: Tiffany Weeks, female   DOB: 67 y.o.   MRN: [id number]   HPI Patient presents stating I am still having a lot of problems with my Achilles and the shockwave helped some but the pain is still back   ROS      Objective:  Physical Exam  Neurovascular status intact with discomfort in the posterior aspect of the heel at the insertion of the tendon into the calcaneus     Assessment:  Chronic Achilles tendinitis     Plan:  Reviewed condition we will try to hold off currently on shockwave but I recommended continued treatment and I am referring for physical therapy to try to provide for better support therapy and to hopefully prevent her from needing open cutting surgery.  May still requiring the

## 2018-03-19 ENCOUNTER — Telehealth: Payer: Self-pay | Admitting: *Deleted

## 2018-03-19 DIAGNOSIS — M7662 Achilles tendinitis, left leg: Secondary | ICD-10-CM

## 2018-03-19 NOTE — Telephone Encounter (Signed)
Hand delivered required [employer] form.

## 2018-03-21 ENCOUNTER — Telehealth: Payer: Self-pay | Admitting: *Deleted

## 2018-03-21 DIAGNOSIS — M25572 Pain in left ankle and joints of left foot: Secondary | ICD-10-CM | POA: Diagnosis not present

## 2018-03-21 DIAGNOSIS — M25672 Stiffness of left ankle, not elsewhere classified: Secondary | ICD-10-CM | POA: Diagnosis not present

## 2018-03-21 DIAGNOSIS — R269 Unspecified abnormalities of gait and mobility: Secondary | ICD-10-CM | POA: Diagnosis not present

## 2018-03-21 DIAGNOSIS — M25472 Effusion, left ankle: Secondary | ICD-10-CM | POA: Diagnosis not present

## 2018-03-21 MED ORDER — NONFORMULARY OR COMPOUNDED ITEM: 11 refills | Status: DC

## 2018-03-21 NOTE — Telephone Encounter (Signed)
I informed pt Tiffany Weeks would take care of the Dexamethasone liquid solution for her PT and gave the address and phone number. Orders called to [facility].

## 2018-03-21 NOTE — Telephone Encounter (Signed)
[facility] states pt needs Dexamethasone liquid solution 0.4% 16 oz.

## 2018-03-21 NOTE — Telephone Encounter (Signed)
[facility] states they can no long make the Dexamethasone liquid solution, suggested [facility] [phone] or [facility] [phone].

## 2018-03-21 NOTE — Telephone Encounter (Signed)
[facility] states they do make Dexamethasone 0.4% liquid solution and it is stable for 30 days.

## 2018-03-26 DIAGNOSIS — M25472 Effusion, left ankle: Secondary | ICD-10-CM | POA: Diagnosis not present

## 2018-03-26 DIAGNOSIS — M25572 Pain in left ankle and joints of left foot: Secondary | ICD-10-CM | POA: Diagnosis not present

## 2018-03-26 DIAGNOSIS — M25672 Stiffness of left ankle, not elsewhere classified: Secondary | ICD-10-CM | POA: Diagnosis not present

## 2018-03-26 DIAGNOSIS — R269 Unspecified abnormalities of gait and mobility: Secondary | ICD-10-CM | POA: Diagnosis not present

## 2018-03-29 DIAGNOSIS — R269 Unspecified abnormalities of gait and mobility: Secondary | ICD-10-CM | POA: Diagnosis not present

## 2018-03-29 DIAGNOSIS — M25672 Stiffness of left ankle, not elsewhere classified: Secondary | ICD-10-CM | POA: Diagnosis not present

## 2018-03-29 DIAGNOSIS — M25572 Pain in left ankle and joints of left foot: Secondary | ICD-10-CM | POA: Diagnosis not present

## 2018-03-29 DIAGNOSIS — M25472 Effusion, left ankle: Secondary | ICD-10-CM | POA: Diagnosis not present

## 2018-04-02 DIAGNOSIS — M25672 Stiffness of left ankle, not elsewhere classified: Secondary | ICD-10-CM | POA: Diagnosis not present

## 2018-04-02 DIAGNOSIS — M25572 Pain in left ankle and joints of left foot: Secondary | ICD-10-CM | POA: Diagnosis not present

## 2018-04-02 DIAGNOSIS — R269 Unspecified abnormalities of gait and mobility: Secondary | ICD-10-CM | POA: Diagnosis not present

## 2018-04-02 DIAGNOSIS — M25472 Effusion, left ankle: Secondary | ICD-10-CM | POA: Diagnosis not present

## 2018-04-04 DIAGNOSIS — R269 Unspecified abnormalities of gait and mobility: Secondary | ICD-10-CM | POA: Diagnosis not present

## 2018-04-04 DIAGNOSIS — M25472 Effusion, left ankle: Secondary | ICD-10-CM | POA: Diagnosis not present

## 2018-04-04 DIAGNOSIS — M25672 Stiffness of left ankle, not elsewhere classified: Secondary | ICD-10-CM | POA: Diagnosis not present

## 2018-04-04 DIAGNOSIS — M25572 Pain in left ankle and joints of left foot: Secondary | ICD-10-CM | POA: Diagnosis not present

## 2018-04-08 DIAGNOSIS — M25572 Pain in left ankle and joints of left foot: Secondary | ICD-10-CM | POA: Diagnosis not present

## 2018-04-08 DIAGNOSIS — R269 Unspecified abnormalities of gait and mobility: Secondary | ICD-10-CM | POA: Diagnosis not present

## 2018-04-08 DIAGNOSIS — M25672 Stiffness of left ankle, not elsewhere classified: Secondary | ICD-10-CM | POA: Diagnosis not present

## 2018-04-08 DIAGNOSIS — M25472 Effusion, left ankle: Secondary | ICD-10-CM | POA: Diagnosis not present

## 2018-04-11 DIAGNOSIS — M25472 Effusion, left ankle: Secondary | ICD-10-CM | POA: Diagnosis not present

## 2018-04-11 DIAGNOSIS — R269 Unspecified abnormalities of gait and mobility: Secondary | ICD-10-CM | POA: Diagnosis not present

## 2018-04-11 DIAGNOSIS — M25672 Stiffness of left ankle, not elsewhere classified: Secondary | ICD-10-CM | POA: Diagnosis not present

## 2018-04-11 DIAGNOSIS — M25572 Pain in left ankle and joints of left foot: Secondary | ICD-10-CM | POA: Diagnosis not present

## 2018-04-15 DIAGNOSIS — M25472 Effusion, left ankle: Secondary | ICD-10-CM | POA: Diagnosis not present

## 2018-04-15 DIAGNOSIS — M25572 Pain in left ankle and joints of left foot: Secondary | ICD-10-CM | POA: Diagnosis not present

## 2018-04-15 DIAGNOSIS — M25672 Stiffness of left ankle, not elsewhere classified: Secondary | ICD-10-CM | POA: Diagnosis not present

## 2018-04-15 DIAGNOSIS — R269 Unspecified abnormalities of gait and mobility: Secondary | ICD-10-CM | POA: Diagnosis not present

## 2018-04-19 DIAGNOSIS — M25672 Stiffness of left ankle, not elsewhere classified: Secondary | ICD-10-CM | POA: Diagnosis not present

## 2018-04-19 DIAGNOSIS — R269 Unspecified abnormalities of gait and mobility: Secondary | ICD-10-CM | POA: Diagnosis not present

## 2018-04-19 DIAGNOSIS — M25472 Effusion, left ankle: Secondary | ICD-10-CM | POA: Diagnosis not present

## 2018-04-19 DIAGNOSIS — M25572 Pain in left ankle and joints of left foot: Secondary | ICD-10-CM | POA: Diagnosis not present

## 2018-04-22 DIAGNOSIS — M25672 Stiffness of left ankle, not elsewhere classified: Secondary | ICD-10-CM | POA: Diagnosis not present

## 2018-04-22 DIAGNOSIS — M25572 Pain in left ankle and joints of left foot: Secondary | ICD-10-CM | POA: Diagnosis not present

## 2018-04-22 DIAGNOSIS — M25472 Effusion, left ankle: Secondary | ICD-10-CM | POA: Diagnosis not present

## 2018-04-22 DIAGNOSIS — R269 Unspecified abnormalities of gait and mobility: Secondary | ICD-10-CM | POA: Diagnosis not present

## 2018-04-24 DIAGNOSIS — M25572 Pain in left ankle and joints of left foot: Secondary | ICD-10-CM | POA: Diagnosis not present

## 2018-04-24 DIAGNOSIS — M25672 Stiffness of left ankle, not elsewhere classified: Secondary | ICD-10-CM | POA: Diagnosis not present

## 2018-04-24 DIAGNOSIS — M25472 Effusion, left ankle: Secondary | ICD-10-CM | POA: Diagnosis not present

## 2018-04-24 DIAGNOSIS — R269 Unspecified abnormalities of gait and mobility: Secondary | ICD-10-CM | POA: Diagnosis not present

## 2018-04-29 DIAGNOSIS — R269 Unspecified abnormalities of gait and mobility: Secondary | ICD-10-CM | POA: Diagnosis not present

## 2018-04-29 DIAGNOSIS — M25672 Stiffness of left ankle, not elsewhere classified: Secondary | ICD-10-CM | POA: Diagnosis not present

## 2018-04-29 DIAGNOSIS — M25472 Effusion, left ankle: Secondary | ICD-10-CM | POA: Diagnosis not present

## 2018-04-29 DIAGNOSIS — M25572 Pain in left ankle and joints of left foot: Secondary | ICD-10-CM | POA: Diagnosis not present

## 2018-05-01 DIAGNOSIS — M25672 Stiffness of left ankle, not elsewhere classified: Secondary | ICD-10-CM | POA: Diagnosis not present

## 2018-05-01 DIAGNOSIS — M25472 Effusion, left ankle: Secondary | ICD-10-CM | POA: Diagnosis not present

## 2018-05-01 DIAGNOSIS — R269 Unspecified abnormalities of gait and mobility: Secondary | ICD-10-CM | POA: Diagnosis not present

## 2018-05-01 DIAGNOSIS — M25572 Pain in left ankle and joints of left foot: Secondary | ICD-10-CM | POA: Diagnosis not present

## 2018-05-06 DIAGNOSIS — R269 Unspecified abnormalities of gait and mobility: Secondary | ICD-10-CM | POA: Diagnosis not present

## 2018-05-06 DIAGNOSIS — M25472 Effusion, left ankle: Secondary | ICD-10-CM | POA: Diagnosis not present

## 2018-05-06 DIAGNOSIS — M25672 Stiffness of left ankle, not elsewhere classified: Secondary | ICD-10-CM | POA: Diagnosis not present

## 2018-05-06 DIAGNOSIS — M25572 Pain in left ankle and joints of left foot: Secondary | ICD-10-CM | POA: Diagnosis not present

## 2018-05-08 DIAGNOSIS — M25672 Stiffness of left ankle, not elsewhere classified: Secondary | ICD-10-CM | POA: Diagnosis not present

## 2018-05-08 DIAGNOSIS — M25572 Pain in left ankle and joints of left foot: Secondary | ICD-10-CM | POA: Diagnosis not present

## 2018-05-08 DIAGNOSIS — R269 Unspecified abnormalities of gait and mobility: Secondary | ICD-10-CM | POA: Diagnosis not present

## 2018-05-08 DIAGNOSIS — M25472 Effusion, left ankle: Secondary | ICD-10-CM | POA: Diagnosis not present

## 2018-05-09 DIAGNOSIS — S80862A Insect bite (nonvenomous), left lower leg, initial encounter: Secondary | ICD-10-CM | POA: Diagnosis not present

## 2018-05-09 DIAGNOSIS — M545 Low back pain: Secondary | ICD-10-CM | POA: Diagnosis not present

## 2018-05-15 DIAGNOSIS — R269 Unspecified abnormalities of gait and mobility: Secondary | ICD-10-CM | POA: Diagnosis not present

## 2018-05-15 DIAGNOSIS — M25672 Stiffness of left ankle, not elsewhere classified: Secondary | ICD-10-CM | POA: Diagnosis not present

## 2018-05-15 DIAGNOSIS — M25572 Pain in left ankle and joints of left foot: Secondary | ICD-10-CM | POA: Diagnosis not present

## 2018-05-15 DIAGNOSIS — M25472 Effusion, left ankle: Secondary | ICD-10-CM | POA: Diagnosis not present

## 2018-07-15 DIAGNOSIS — H40013 Open angle with borderline findings, low risk, bilateral: Secondary | ICD-10-CM | POA: Diagnosis not present

## 2018-07-15 DIAGNOSIS — E119 Type 2 diabetes mellitus without complications: Secondary | ICD-10-CM | POA: Diagnosis not present

## 2018-07-15 DIAGNOSIS — H1859 Other hereditary corneal dystrophies: Secondary | ICD-10-CM | POA: Diagnosis not present

## 2018-08-12 DIAGNOSIS — Z23 Encounter for immunization: Secondary | ICD-10-CM | POA: Diagnosis not present

## 2018-09-25 DIAGNOSIS — E119 Type 2 diabetes mellitus without complications: Secondary | ICD-10-CM | POA: Diagnosis not present

## 2018-09-25 DIAGNOSIS — Z7984 Long term (current) use of oral hypoglycemic drugs: Secondary | ICD-10-CM | POA: Diagnosis not present

## 2018-09-25 DIAGNOSIS — E785 Hyperlipidemia, unspecified: Secondary | ICD-10-CM | POA: Diagnosis not present

## 2018-09-25 DIAGNOSIS — E559 Vitamin D deficiency, unspecified: Secondary | ICD-10-CM | POA: Diagnosis not present

## 2018-09-27 DIAGNOSIS — E119 Type 2 diabetes mellitus without complications: Secondary | ICD-10-CM | POA: Diagnosis not present

## 2018-09-27 DIAGNOSIS — Z86010 Personal history of colonic polyps: Secondary | ICD-10-CM | POA: Diagnosis not present

## 2018-09-27 DIAGNOSIS — Z1389 Encounter for screening for other disorder: Secondary | ICD-10-CM | POA: Diagnosis not present

## 2018-09-27 DIAGNOSIS — Z0000 Encounter for general adult medical examination without abnormal findings: Secondary | ICD-10-CM | POA: Diagnosis not present

## 2018-09-27 DIAGNOSIS — E785 Hyperlipidemia, unspecified: Secondary | ICD-10-CM | POA: Diagnosis not present

## 2018-09-27 DIAGNOSIS — L989 Disorder of the skin and subcutaneous tissue, unspecified: Secondary | ICD-10-CM | POA: Diagnosis not present

## 2018-09-27 DIAGNOSIS — R220 Localized swelling, mass and lump, head: Secondary | ICD-10-CM | POA: Diagnosis not present

## 2018-09-27 DIAGNOSIS — J45909 Unspecified asthma, uncomplicated: Secondary | ICD-10-CM | POA: Diagnosis not present

## 2018-09-27 DIAGNOSIS — Z7984 Long term (current) use of oral hypoglycemic drugs: Secondary | ICD-10-CM | POA: Diagnosis not present

## 2018-09-27 DIAGNOSIS — I10 Essential (primary) hypertension: Secondary | ICD-10-CM | POA: Diagnosis not present

## 2018-09-27 DIAGNOSIS — E559 Vitamin D deficiency, unspecified: Secondary | ICD-10-CM | POA: Diagnosis not present

## 2018-09-27 DIAGNOSIS — K219 Gastro-esophageal reflux disease without esophagitis: Secondary | ICD-10-CM | POA: Diagnosis not present

## 2018-10-22 DIAGNOSIS — Z7984 Long term (current) use of oral hypoglycemic drugs: Secondary | ICD-10-CM | POA: Diagnosis not present

## 2018-10-22 DIAGNOSIS — R0981 Nasal congestion: Secondary | ICD-10-CM | POA: Diagnosis not present

## 2018-10-22 DIAGNOSIS — E119 Type 2 diabetes mellitus without complications: Secondary | ICD-10-CM | POA: Diagnosis not present

## 2018-10-25 DIAGNOSIS — K219 Gastro-esophageal reflux disease without esophagitis: Secondary | ICD-10-CM | POA: Diagnosis not present

## 2018-10-25 DIAGNOSIS — R1310 Dysphagia, unspecified: Secondary | ICD-10-CM | POA: Diagnosis not present

## 2018-10-25 DIAGNOSIS — Z86010 Personal history of colonic polyps: Secondary | ICD-10-CM | POA: Diagnosis not present

## 2018-12-02 ENCOUNTER — Other Ambulatory Visit: Payer: Self-pay | Admitting: Family Medicine

## 2018-12-02 DIAGNOSIS — Z1231 Encounter for screening mammogram for malignant neoplasm of breast: Secondary | ICD-10-CM

## 2018-12-11 ENCOUNTER — Other Ambulatory Visit (HOSPITAL_COMMUNITY)
Admission: RE | Admit: 2018-12-11 | Discharge: 2018-12-11 | Disposition: A | Discharge status: Home / Self Care | Payer: Medicare Other | Source: Ambulatory Visit | Attending: Obstetrics and Gynecology | Admitting: Obstetrics and Gynecology

## 2018-12-11 ENCOUNTER — Other Ambulatory Visit: Payer: Self-pay | Admitting: Obstetrics and Gynecology

## 2018-12-11 DIAGNOSIS — Z01419 Encounter for gynecological examination (general) (routine) without abnormal findings: Secondary | ICD-10-CM | POA: Insufficient documentation

## 2018-12-13 DIAGNOSIS — K219 Gastro-esophageal reflux disease without esophagitis: Secondary | ICD-10-CM | POA: Diagnosis not present

## 2018-12-13 DIAGNOSIS — R1310 Dysphagia, unspecified: Secondary | ICD-10-CM | POA: Diagnosis not present

## 2018-12-13 DIAGNOSIS — K317 Polyp of stomach and duodenum: Secondary | ICD-10-CM | POA: Diagnosis not present

## 2018-12-13 LAB — CYTOLOGY - PAP
Diagnosis: NEGATIVE
HPV (WINDOPATH): NOT DETECTED

## 2018-12-17 DIAGNOSIS — K317 Polyp of stomach and duodenum: Secondary | ICD-10-CM | POA: Diagnosis not present

## 2019-01-06 ENCOUNTER — Ambulatory Visit: Payer: Medicare Other

## 2019-02-06 ENCOUNTER — Ambulatory Visit: Payer: Medicare Other

## 2019-02-12 ENCOUNTER — Other Ambulatory Visit: Payer: Self-pay | Admitting: Cardiovascular Disease

## 2019-02-16 ENCOUNTER — Other Ambulatory Visit: Payer: Self-pay | Admitting: Cardiovascular Disease

## 2019-03-20 DIAGNOSIS — H1859 Other hereditary corneal dystrophies: Secondary | ICD-10-CM | POA: Diagnosis not present

## 2019-03-20 DIAGNOSIS — H04123 Dry eye syndrome of bilateral lacrimal glands: Secondary | ICD-10-CM | POA: Diagnosis not present

## 2019-03-20 DIAGNOSIS — E119 Type 2 diabetes mellitus without complications: Secondary | ICD-10-CM | POA: Diagnosis not present

## 2019-03-20 DIAGNOSIS — H40013 Open angle with borderline findings, low risk, bilateral: Secondary | ICD-10-CM | POA: Diagnosis not present

## 2019-03-28 ENCOUNTER — Ambulatory Visit
Admission: RE | Admit: 2019-03-28 | Discharge: 2019-03-28 | Disposition: A | Discharge status: Home / Self Care | Payer: Medicare Other | Source: Ambulatory Visit | Attending: Family Medicine | Admitting: Family Medicine

## 2019-03-28 ENCOUNTER — Other Ambulatory Visit: Payer: Self-pay

## 2019-03-28 DIAGNOSIS — Z1231 Encounter for screening mammogram for malignant neoplasm of breast: Secondary | ICD-10-CM

## 2019-05-12 ENCOUNTER — Other Ambulatory Visit: Payer: Self-pay | Admitting: Cardiovascular Disease

## 2019-05-15 DIAGNOSIS — K219 Gastro-esophageal reflux disease without esophagitis: Secondary | ICD-10-CM | POA: Diagnosis not present

## 2019-05-15 DIAGNOSIS — I10 Essential (primary) hypertension: Secondary | ICD-10-CM | POA: Diagnosis not present

## 2019-05-15 DIAGNOSIS — E785 Hyperlipidemia, unspecified: Secondary | ICD-10-CM | POA: Diagnosis not present

## 2019-05-15 DIAGNOSIS — E1165 Type 2 diabetes mellitus with hyperglycemia: Secondary | ICD-10-CM | POA: Diagnosis not present

## 2019-05-15 DIAGNOSIS — Z7984 Long term (current) use of oral hypoglycemic drugs: Secondary | ICD-10-CM | POA: Diagnosis not present

## 2019-05-15 DIAGNOSIS — E559 Vitamin D deficiency, unspecified: Secondary | ICD-10-CM | POA: Diagnosis not present

## 2019-05-21 DIAGNOSIS — E1165 Type 2 diabetes mellitus with hyperglycemia: Secondary | ICD-10-CM | POA: Diagnosis not present

## 2019-05-21 DIAGNOSIS — I10 Essential (primary) hypertension: Secondary | ICD-10-CM | POA: Diagnosis not present

## 2019-05-21 DIAGNOSIS — K219 Gastro-esophageal reflux disease without esophagitis: Secondary | ICD-10-CM | POA: Diagnosis not present

## 2019-05-21 DIAGNOSIS — E785 Hyperlipidemia, unspecified: Secondary | ICD-10-CM | POA: Diagnosis not present

## 2019-05-21 DIAGNOSIS — E559 Vitamin D deficiency, unspecified: Secondary | ICD-10-CM | POA: Diagnosis not present

## 2019-06-06 DIAGNOSIS — D649 Anemia, unspecified: Secondary | ICD-10-CM | POA: Diagnosis not present

## 2019-06-06 DIAGNOSIS — E1165 Type 2 diabetes mellitus with hyperglycemia: Secondary | ICD-10-CM | POA: Diagnosis not present

## 2019-06-11 ENCOUNTER — Other Ambulatory Visit: Payer: Self-pay | Admitting: Gastroenterology

## 2019-06-11 DIAGNOSIS — K219 Gastro-esophageal reflux disease without esophagitis: Secondary | ICD-10-CM | POA: Diagnosis not present

## 2019-06-11 DIAGNOSIS — R112 Nausea with vomiting, unspecified: Secondary | ICD-10-CM

## 2019-06-11 DIAGNOSIS — R110 Nausea: Secondary | ICD-10-CM | POA: Diagnosis not present

## 2019-06-11 DIAGNOSIS — D649 Anemia, unspecified: Secondary | ICD-10-CM | POA: Diagnosis not present

## 2019-06-16 DIAGNOSIS — D649 Anemia, unspecified: Secondary | ICD-10-CM | POA: Diagnosis not present

## 2019-06-20 ENCOUNTER — Encounter (HOSPITAL_COMMUNITY)
Admission: RE | Admit: 2019-06-20 | Discharge: 2019-06-20 | Disposition: A | Discharge status: Home / Self Care | Payer: Medicare Other | Source: Ambulatory Visit | Attending: Gastroenterology | Admitting: Gastroenterology

## 2019-06-20 ENCOUNTER — Other Ambulatory Visit: Payer: Self-pay

## 2019-06-20 DIAGNOSIS — R112 Nausea with vomiting, unspecified: Secondary | ICD-10-CM | POA: Diagnosis not present

## 2019-06-20 MED ORDER — TECHNETIUM TC 99M SULFUR COLLOID
2.0000 | Freq: Once | INTRAVENOUS | Status: AC | PRN
  Administered 2019-06-20: 2 via ORAL

## 2019-06-25 DIAGNOSIS — D649 Anemia, unspecified: Secondary | ICD-10-CM | POA: Diagnosis not present

## 2019-07-28 DIAGNOSIS — D649 Anemia, unspecified: Secondary | ICD-10-CM | POA: Diagnosis not present

## 2019-07-28 DIAGNOSIS — Z23 Encounter for immunization: Secondary | ICD-10-CM | POA: Diagnosis not present

## 2019-08-06 ENCOUNTER — Other Ambulatory Visit: Payer: Self-pay | Admitting: Cardiovascular Disease

## 2019-08-13 DIAGNOSIS — D649 Anemia, unspecified: Secondary | ICD-10-CM | POA: Diagnosis not present

## 2019-08-13 DIAGNOSIS — R110 Nausea: Secondary | ICD-10-CM | POA: Diagnosis not present

## 2019-08-13 DIAGNOSIS — Z86010 Personal history of colonic polyps: Secondary | ICD-10-CM | POA: Diagnosis not present

## 2019-08-22 DIAGNOSIS — Z7984 Long term (current) use of oral hypoglycemic drugs: Secondary | ICD-10-CM | POA: Diagnosis not present

## 2019-08-22 DIAGNOSIS — D649 Anemia, unspecified: Secondary | ICD-10-CM | POA: Diagnosis not present

## 2019-08-22 DIAGNOSIS — E1165 Type 2 diabetes mellitus with hyperglycemia: Secondary | ICD-10-CM | POA: Diagnosis not present

## 2019-08-28 ENCOUNTER — Other Ambulatory Visit: Payer: Self-pay | Admitting: Cardiovascular Disease

## 2019-09-15 ENCOUNTER — Other Ambulatory Visit: Payer: Self-pay

## 2019-09-15 ENCOUNTER — Other Ambulatory Visit: Payer: Self-pay | Admitting: Gastroenterology

## 2019-09-15 DIAGNOSIS — R109 Unspecified abdominal pain: Secondary | ICD-10-CM

## 2019-09-15 DIAGNOSIS — R140 Abdominal distension (gaseous): Secondary | ICD-10-CM

## 2019-09-15 DIAGNOSIS — Z20828 Contact with and (suspected) exposure to other viral communicable diseases: Secondary | ICD-10-CM | POA: Diagnosis not present

## 2019-09-15 DIAGNOSIS — Z20822 Contact with and (suspected) exposure to covid-19: Secondary | ICD-10-CM

## 2019-09-16 LAB — NOVEL CORONAVIRUS, NAA: SARS-CoV-2, NAA: NOT DETECTED

## 2019-09-26 ENCOUNTER — Ambulatory Visit
Admission: RE | Admit: 2019-09-26 | Discharge: 2019-09-26 | Disposition: A | Discharge status: Home / Self Care | Payer: Federal, State, Local not specified - PPO | Source: Ambulatory Visit | Attending: Gastroenterology | Admitting: Gastroenterology

## 2019-09-26 DIAGNOSIS — R109 Unspecified abdominal pain: Secondary | ICD-10-CM

## 2019-09-26 DIAGNOSIS — R140 Abdominal distension (gaseous): Secondary | ICD-10-CM

## 2019-09-26 DIAGNOSIS — K760 Fatty (change of) liver, not elsewhere classified: Secondary | ICD-10-CM | POA: Diagnosis not present

## 2019-09-27 ENCOUNTER — Other Ambulatory Visit: Payer: Self-pay | Admitting: Cardiovascular Disease

## 2019-09-29 ENCOUNTER — Other Ambulatory Visit: Payer: Self-pay | Admitting: Gastroenterology

## 2019-09-29 DIAGNOSIS — Z86010 Personal history of colonic polyps: Secondary | ICD-10-CM | POA: Diagnosis not present

## 2019-09-29 DIAGNOSIS — R935 Abnormal findings on diagnostic imaging of other abdominal regions, including retroperitoneum: Secondary | ICD-10-CM | POA: Diagnosis not present

## 2019-09-29 DIAGNOSIS — R110 Nausea: Secondary | ICD-10-CM | POA: Diagnosis not present

## 2019-09-29 DIAGNOSIS — N289 Disorder of kidney and ureter, unspecified: Secondary | ICD-10-CM

## 2019-10-02 ENCOUNTER — Other Ambulatory Visit: Payer: Self-pay | Admitting: Family Medicine

## 2019-10-02 DIAGNOSIS — M8580 Other specified disorders of bone density and structure, unspecified site: Secondary | ICD-10-CM

## 2019-10-07 ENCOUNTER — Ambulatory Visit
Admission: RE | Admit: 2019-10-07 | Discharge: 2019-10-07 | Disposition: A | Discharge status: Home / Self Care | Payer: Federal, State, Local not specified - PPO | Source: Ambulatory Visit | Attending: Gastroenterology | Admitting: Gastroenterology

## 2019-10-07 DIAGNOSIS — N289 Disorder of kidney and ureter, unspecified: Secondary | ICD-10-CM

## 2019-10-07 MED ORDER — IOPAMIDOL (ISOVUE-370) INJECTION 76%
100.0000 mL | Freq: Once | INTRAVENOUS | Status: AC | PRN
  Administered 2019-10-07: 11:00:00 100 mL via INTRAVENOUS

## 2019-10-08 ENCOUNTER — Ambulatory Visit: Payer: Federal, State, Local not specified - PPO | Attending: Internal Medicine

## 2019-10-08 DIAGNOSIS — Z20822 Contact with and (suspected) exposure to covid-19: Secondary | ICD-10-CM

## 2019-10-09 LAB — NOVEL CORONAVIRUS, NAA: SARS-CoV-2, NAA: NOT DETECTED

## 2019-10-29 DIAGNOSIS — H18593 Other hereditary corneal dystrophies, bilateral: Secondary | ICD-10-CM | POA: Diagnosis not present

## 2019-10-29 DIAGNOSIS — H40013 Open angle with borderline findings, low risk, bilateral: Secondary | ICD-10-CM | POA: Diagnosis not present

## 2019-10-29 DIAGNOSIS — H04123 Dry eye syndrome of bilateral lacrimal glands: Secondary | ICD-10-CM | POA: Diagnosis not present

## 2019-10-29 DIAGNOSIS — E119 Type 2 diabetes mellitus without complications: Secondary | ICD-10-CM | POA: Diagnosis not present

## 2019-10-30 ENCOUNTER — Other Ambulatory Visit: Payer: Self-pay | Admitting: Cardiovascular Disease

## 2019-11-24 DIAGNOSIS — Z86010 Personal history of colonic polyps: Secondary | ICD-10-CM | POA: Diagnosis not present

## 2019-11-24 DIAGNOSIS — R110 Nausea: Secondary | ICD-10-CM | POA: Diagnosis not present

## 2019-11-24 DIAGNOSIS — R109 Unspecified abdominal pain: Secondary | ICD-10-CM | POA: Diagnosis not present

## 2019-11-30 ENCOUNTER — Ambulatory Visit: Payer: Federal, State, Local not specified - PPO | Attending: Internal Medicine

## 2019-11-30 DIAGNOSIS — Z23 Encounter for immunization: Secondary | ICD-10-CM | POA: Insufficient documentation

## 2019-11-30 NOTE — Progress Notes (Signed)
   Covid-19 Vaccination Clinic  Name:  Tiffany Weeks    MRN: [id number] DOB: Aug 22, 1951  11/30/2019  [name] was observed post Covid-19 immunization for 30 minutes based on pre-vaccination screening without incidence. She was provided with Vaccine Information Sheet and instruction to access the V-Safe system.   [name] was instructed to call 911 with any severe reactions post vaccine: Marland Kitchen Difficulty breathing  . Swelling of your face and throat  . A fast heartbeat  . A bad rash all over your body  . Dizziness and weakness    Immunizations Administered    Name Date Dose VIS Date Route   Pfizer COVID-19 Vaccine 11/30/2019 12:57 PM 0.3 mL 09/26/2019 Intramuscular   Manufacturer: [place]   Lot: [id number]   [place]: [id number]

## 2019-12-10 DIAGNOSIS — D649 Anemia, unspecified: Secondary | ICD-10-CM | POA: Diagnosis not present

## 2019-12-10 DIAGNOSIS — E119 Type 2 diabetes mellitus without complications: Secondary | ICD-10-CM | POA: Diagnosis not present

## 2019-12-23 ENCOUNTER — Ambulatory Visit: Payer: Federal, State, Local not specified - PPO | Attending: Internal Medicine

## 2019-12-23 DIAGNOSIS — Z23 Encounter for immunization: Secondary | ICD-10-CM | POA: Insufficient documentation

## 2019-12-23 NOTE — Progress Notes (Signed)
   Covid-19 Vaccination Clinic  Name:  Tiffany Weeks    MRN: [id number] DOB: 12/16/1950  12/23/2019  Tiffany Weeks was observed post Covid-19 immunization for 30 minutes based on pre-vaccination screening without incident. She was provided with Vaccine Information Sheet and instruction to access the V-Safe system.   Tiffany Weeks was instructed to call 911 with any severe reactions post vaccine: Marland Kitchen Difficulty breathing  . Swelling of face and throat  . A fast heartbeat  . A bad rash all over body  . Dizziness and weakness   Immunizations Administered    Name Date Dose VIS Date Route   Pfizer COVID-19 Vaccine 12/23/2019 11:32 AM 0.3 mL 09/26/2019 Intramuscular   Manufacturer: [place]   Lot: [id number]   [place]: [id number]

## 2019-12-26 DIAGNOSIS — R140 Abdominal distension (gaseous): Secondary | ICD-10-CM | POA: Diagnosis not present

## 2020-01-02 ENCOUNTER — Other Ambulatory Visit: Payer: Self-pay

## 2020-01-02 ENCOUNTER — Ambulatory Visit
Admission: RE | Admit: 2020-01-02 | Discharge: 2020-01-02 | Disposition: A | Discharge status: Home / Self Care | Payer: Federal, State, Local not specified - PPO | Source: Ambulatory Visit | Attending: Family Medicine | Admitting: Family Medicine

## 2020-01-02 DIAGNOSIS — M85852 Other specified disorders of bone density and structure, left thigh: Secondary | ICD-10-CM | POA: Diagnosis not present

## 2020-01-02 DIAGNOSIS — Z780 Asymptomatic menopausal state: Secondary | ICD-10-CM | POA: Diagnosis not present

## 2020-01-02 DIAGNOSIS — M8580 Other specified disorders of bone density and structure, unspecified site: Secondary | ICD-10-CM

## 2020-01-07 DIAGNOSIS — R140 Abdominal distension (gaseous): Secondary | ICD-10-CM | POA: Diagnosis not present

## 2020-01-24 ENCOUNTER — Other Ambulatory Visit: Payer: Self-pay | Admitting: Cardiovascular Disease

## 2020-02-03 ENCOUNTER — Other Ambulatory Visit: Payer: Self-pay | Admitting: Cardiovascular Disease

## 2020-02-12 ENCOUNTER — Other Ambulatory Visit: Payer: Self-pay | Admitting: Family Medicine

## 2020-02-12 DIAGNOSIS — Z1231 Encounter for screening mammogram for malignant neoplasm of breast: Secondary | ICD-10-CM

## 2020-03-18 NOTE — Progress Notes (Signed)
Patient ID: Tiffany Weeks, female   DOB: 1950-10-25, 69 y.o.   MRN: [id number]     69 y.o. history of atypical chest pain and palpitations. Normal myovue in 2013 and 2017. Event monitor with no arrhythmias. CRF;s HTN, HLD and DM-2 Has had nausea with diuretic and cozaar in past. Widowed in 2015     Sees Dr Paulita Fujita for GERD EGD and motility study ok  Changing to zantac Had black stools occasionally colonoscopy December 2019  normal   Having chronic left heal pain Received ESWT Rx 01/29/18  05/21/19 A1c was 8.1 Had COVID vaccine March 2021   No cardiac issues    ROS: Denies fever, malais, weight loss, blurry vision, decreased visual acuity, cough, sputum, SOB, hemoptysis, pleuritic pain, palpitaitons, heartburn, abdominal pain, melena, lower extremity edema, claudication, or rash.  All other systems reviewed and negative  General: BP 130/82   Pulse 90   Ht 5\' 3"  (1.6 m)   Wt 192 lb (87.1 kg)   SpO2 97%   BMI 34.01 kg/m  Affect appropriate Overweight white female  HEENT: normal Neck supple with no adenopathy JVP normal no bruits no thyromegaly Lungs clear with no wheezing and good diaphragmatic motion Heart:  S1/S2 no murmur, no rub, gallop or click PMI normal Abdomen: benighn, BS positve, no tenderness, no AAA no bruit.  No HSM or HJR Distal pulses intact with no bruits No edema Neuro non-focal Skin warm and dry No muscular weakness    Current Outpatient Medications  Medication Sig Dispense Refill  . amLODipine (NORVASC) 5 MG tablet TAKE 1 TABLET BY MOUTH EVERY DAY IN THE EVENING. PLEASE MAKE OVERDUE APPT WITH DR. Johnsie Cancel 30 tablet 2  . aspirin 81 MG tablet Take 81 mg by mouth daily.    . cholecalciferol (VITAMIN D) 1000 UNITS tablet Take 1,000 Units by mouth daily.    Marland Kitchen ezetimibe-simvastatin (VYTORIN) 10-40 MG per tablet Take 1 tablet by mouth as directed.     Marland Kitchen glimepiride (AMARYL) 1 MG tablet Take 1 tablet by mouth daily  0  . metFORMIN (GLUCOPHAGE) 500 MG tablet Take 750  mg by mouth 2 (two) times daily with a meal.     . olmesartan (BENICAR) 40 MG tablet Take 1 tablet (40 mg total) by mouth daily. Please keep upcoming appt for future refills. Thank you 90 tablet 3  . Polyethyl Glycol-Propyl Glycol (SYSTANE OP) Place 1-2 drops into both eyes as directed.      No current facility-administered medications for this visit.    Allergies  Align [acidophilus], Bifidobacterium, Losartan, Naprosyn [naproxen], Amoxicillin, Ciprofloxacin hcl, and Tape  Electrocardiogram: 03/22/20 SR rate 90 poor R wave progression no acute changes   Assessment and Plan Chest Pain:  Atypical  myovue 02/09/16 normal EF 63%  HTN:  Well controlled continue current meds home readings reviewed and fine  DM:  Well controlled A1c with primary sees eye doctor and podiatrist regularly A1c with Dr Drema Dallas 5.9 Palpitations resolved  No need for beta blocker  Chol:  LDL under 100 takes vytorin 3x/week  GI:  Told to stop ASA if stools "black" and call Dr Paulita Fujita   Ortho:  Heel pain f/u PT/OT had pulsed shockwave Rx 01/29/18    F/U  As needed   [employer]

## 2020-03-19 DIAGNOSIS — E119 Type 2 diabetes mellitus without complications: Secondary | ICD-10-CM | POA: Diagnosis not present

## 2020-03-22 ENCOUNTER — Other Ambulatory Visit: Payer: Self-pay

## 2020-03-22 ENCOUNTER — Encounter: Payer: Self-pay | Admitting: Cardiovascular Disease

## 2020-03-22 ENCOUNTER — Ambulatory Visit (INDEPENDENT_AMBULATORY_CARE_PROVIDER_SITE_OTHER): Payer: Medicare Other | Admitting: Cardiovascular Disease

## 2020-03-22 VITALS — BP 130/82 | HR 90 | Ht 63.0 in | Wt 192.0 lb

## 2020-03-22 DIAGNOSIS — I10 Essential (primary) hypertension: Secondary | ICD-10-CM | POA: Diagnosis not present

## 2020-03-22 DIAGNOSIS — R0789 Other chest pain: Secondary | ICD-10-CM

## 2020-03-22 NOTE — Patient Instructions (Signed)
Medication Instructions:  *If you need a refill on your cardiac medications before your next appointment, please call your pharmacy*  Lab Work: If you have labs (blood work) drawn today and your tests are completely normal, you will receive your results only by: . MyChart Message (if you have MyChart) OR . A paper copy in the mail If you have any lab test that is abnormal or we need to change your treatment, we will call you to review the results.  Follow-Up: At [facility], you and your health needs are our priority.  As part of our continuing mission to provide you with exceptional heart care, we have created designated Provider Care Teams.  These Care Teams include your primary Cardiologist (physician) and Advanced Practice Providers (APPs -  Physician Assistants and Nurse Practitioners) who all work together to provide you with the care you need, when you need it.  We recommend signing up for the patient portal called "MyChart".  Sign up information is provided on this After Visit Summary.  MyChart is used to connect with patients for Virtual Visits (Telemedicine).  Patients are able to view lab/test results, encounter notes, upcoming appointments, etc.  Non-urgent messages can be sent to your provider as well.   To learn more about what you can do with MyChart, go to [email or web address].    Your next appointment:   As needed  The format for your next appointment:   In Person  Provider:   You may see Dr. Nishan or one of the following Advanced Practice Providers on your designated Care Team:    Lori Gerhardt, NP  Laura Ingold, NP  Jill McDaniel, NP  

## 2020-03-26 DIAGNOSIS — E875 Hyperkalemia: Secondary | ICD-10-CM | POA: Diagnosis not present

## 2020-03-26 DIAGNOSIS — E785 Hyperlipidemia, unspecified: Secondary | ICD-10-CM | POA: Diagnosis not present

## 2020-03-29 ENCOUNTER — Ambulatory Visit
Admission: RE | Admit: 2020-03-29 | Discharge: 2020-03-29 | Disposition: A | Discharge status: Home / Self Care | Payer: Medicare Other | Source: Ambulatory Visit | Attending: Family Medicine | Admitting: Family Medicine

## 2020-03-29 ENCOUNTER — Other Ambulatory Visit: Payer: Self-pay

## 2020-03-29 DIAGNOSIS — Z1231 Encounter for screening mammogram for malignant neoplasm of breast: Secondary | ICD-10-CM | POA: Diagnosis not present

## 2020-03-29 DIAGNOSIS — I10 Essential (primary) hypertension: Secondary | ICD-10-CM | POA: Diagnosis not present

## 2020-03-29 DIAGNOSIS — D649 Anemia, unspecified: Secondary | ICD-10-CM | POA: Diagnosis not present

## 2020-03-29 DIAGNOSIS — E559 Vitamin D deficiency, unspecified: Secondary | ICD-10-CM | POA: Diagnosis not present

## 2020-03-29 DIAGNOSIS — E1165 Type 2 diabetes mellitus with hyperglycemia: Secondary | ICD-10-CM | POA: Diagnosis not present

## 2020-03-29 DIAGNOSIS — Z7984 Long term (current) use of oral hypoglycemic drugs: Secondary | ICD-10-CM | POA: Diagnosis not present

## 2020-03-29 DIAGNOSIS — K219 Gastro-esophageal reflux disease without esophagitis: Secondary | ICD-10-CM | POA: Diagnosis not present

## 2020-03-29 DIAGNOSIS — E785 Hyperlipidemia, unspecified: Secondary | ICD-10-CM | POA: Diagnosis not present

## 2020-04-29 ENCOUNTER — Other Ambulatory Visit: Payer: Self-pay | Admitting: Cardiovascular Disease

## 2020-05-01 DIAGNOSIS — S30860A Insect bite (nonvenomous) of lower back and pelvis, initial encounter: Secondary | ICD-10-CM | POA: Diagnosis not present

## 2020-05-01 DIAGNOSIS — W57XXXA Bitten or stung by nonvenomous insect and other nonvenomous arthropods, initial encounter: Secondary | ICD-10-CM | POA: Diagnosis not present

## 2020-05-03 DIAGNOSIS — H18593 Other hereditary corneal dystrophies, bilateral: Secondary | ICD-10-CM | POA: Diagnosis not present

## 2020-05-03 DIAGNOSIS — H04123 Dry eye syndrome of bilateral lacrimal glands: Secondary | ICD-10-CM | POA: Diagnosis not present

## 2020-05-03 DIAGNOSIS — H40013 Open angle with borderline findings, low risk, bilateral: Secondary | ICD-10-CM | POA: Diagnosis not present

## 2020-05-03 DIAGNOSIS — E119 Type 2 diabetes mellitus without complications: Secondary | ICD-10-CM | POA: Diagnosis not present

## 2020-06-22 DIAGNOSIS — R0981 Nasal congestion: Secondary | ICD-10-CM | POA: Diagnosis not present

## 2020-07-05 DIAGNOSIS — K219 Gastro-esophageal reflux disease without esophagitis: Secondary | ICD-10-CM | POA: Diagnosis not present

## 2020-07-05 DIAGNOSIS — D649 Anemia, unspecified: Secondary | ICD-10-CM | POA: Diagnosis not present

## 2020-07-05 DIAGNOSIS — I10 Essential (primary) hypertension: Secondary | ICD-10-CM | POA: Diagnosis not present

## 2020-07-05 DIAGNOSIS — E785 Hyperlipidemia, unspecified: Secondary | ICD-10-CM | POA: Diagnosis not present

## 2020-07-05 DIAGNOSIS — E1165 Type 2 diabetes mellitus with hyperglycemia: Secondary | ICD-10-CM | POA: Diagnosis not present

## 2020-07-05 DIAGNOSIS — E559 Vitamin D deficiency, unspecified: Secondary | ICD-10-CM | POA: Diagnosis not present

## 2020-07-21 ENCOUNTER — Other Ambulatory Visit: Payer: Self-pay

## 2020-07-21 ENCOUNTER — Other Ambulatory Visit: Payer: Federal, State, Local not specified - PPO

## 2020-07-21 DIAGNOSIS — Z20822 Contact with and (suspected) exposure to covid-19: Secondary | ICD-10-CM

## 2020-07-22 DIAGNOSIS — Z79899 Other long term (current) drug therapy: Secondary | ICD-10-CM | POA: Diagnosis not present

## 2020-07-22 DIAGNOSIS — R5383 Other fatigue: Secondary | ICD-10-CM | POA: Diagnosis not present

## 2020-07-22 LAB — NOVEL CORONAVIRUS, NAA: SARS-CoV-2, NAA: NOT DETECTED

## 2020-07-22 LAB — SARS-COV-2, NAA 2 DAY TAT

## 2020-08-12 DIAGNOSIS — Z6833 Body mass index (BMI) 33.0-33.9, adult: Secondary | ICD-10-CM | POA: Diagnosis not present

## 2020-08-12 DIAGNOSIS — D649 Anemia, unspecified: Secondary | ICD-10-CM | POA: Diagnosis not present

## 2020-08-12 DIAGNOSIS — J45909 Unspecified asthma, uncomplicated: Secondary | ICD-10-CM | POA: Diagnosis not present

## 2020-08-12 DIAGNOSIS — I10 Essential (primary) hypertension: Secondary | ICD-10-CM | POA: Diagnosis not present

## 2020-08-12 DIAGNOSIS — E785 Hyperlipidemia, unspecified: Secondary | ICD-10-CM | POA: Diagnosis not present

## 2020-08-12 DIAGNOSIS — K219 Gastro-esophageal reflux disease without esophagitis: Secondary | ICD-10-CM | POA: Diagnosis not present

## 2020-08-12 DIAGNOSIS — E1165 Type 2 diabetes mellitus with hyperglycemia: Secondary | ICD-10-CM | POA: Diagnosis not present

## 2020-08-17 DIAGNOSIS — E538 Deficiency of other specified B group vitamins: Secondary | ICD-10-CM | POA: Diagnosis not present

## 2020-08-30 DIAGNOSIS — Z23 Encounter for immunization: Secondary | ICD-10-CM | POA: Diagnosis not present

## 2020-09-09 IMAGING — MG DIGITAL SCREENING BILAT W/ TOMO W/ CAD
6 of 10 series · 6 of 30 positions shown · non-contrast
Comparison: Previous exam(s).

CLINICAL DATA: Screening.

EXAM:
DIGITAL SCREENING BILATERAL MAMMOGRAM WITH TOMO AND CAD

[R MLO synth-2D (1 of 2)]
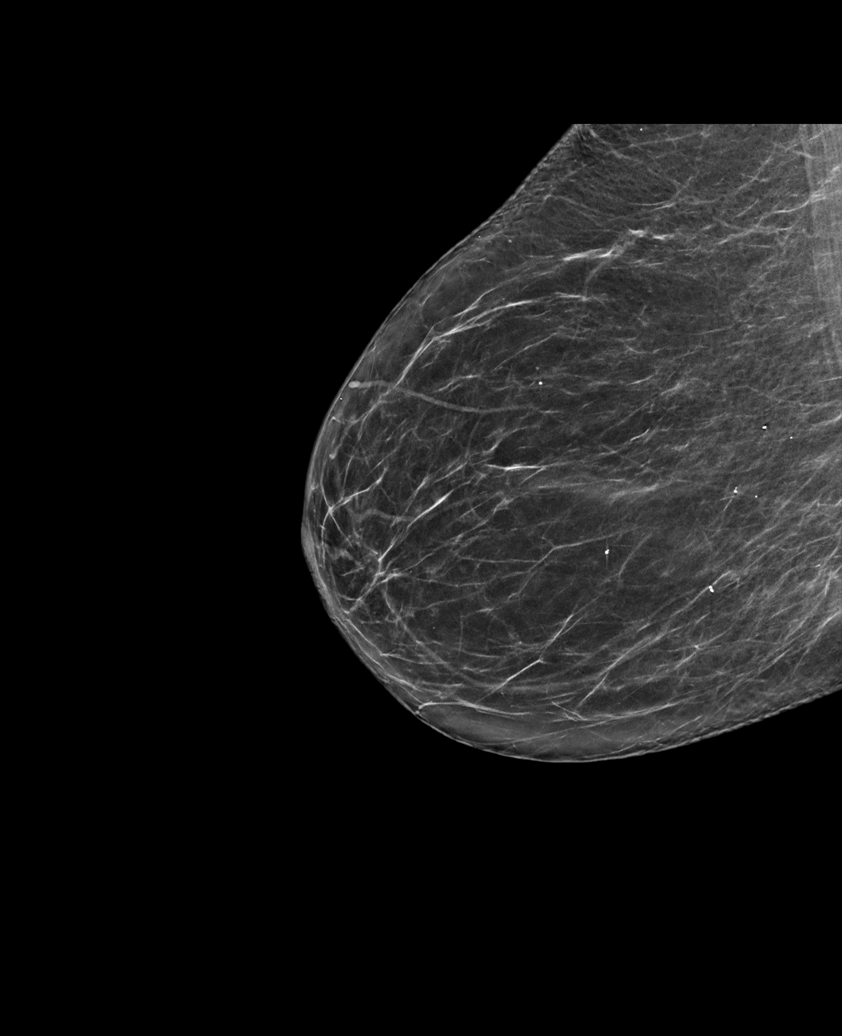

[L MLO synth-2D]
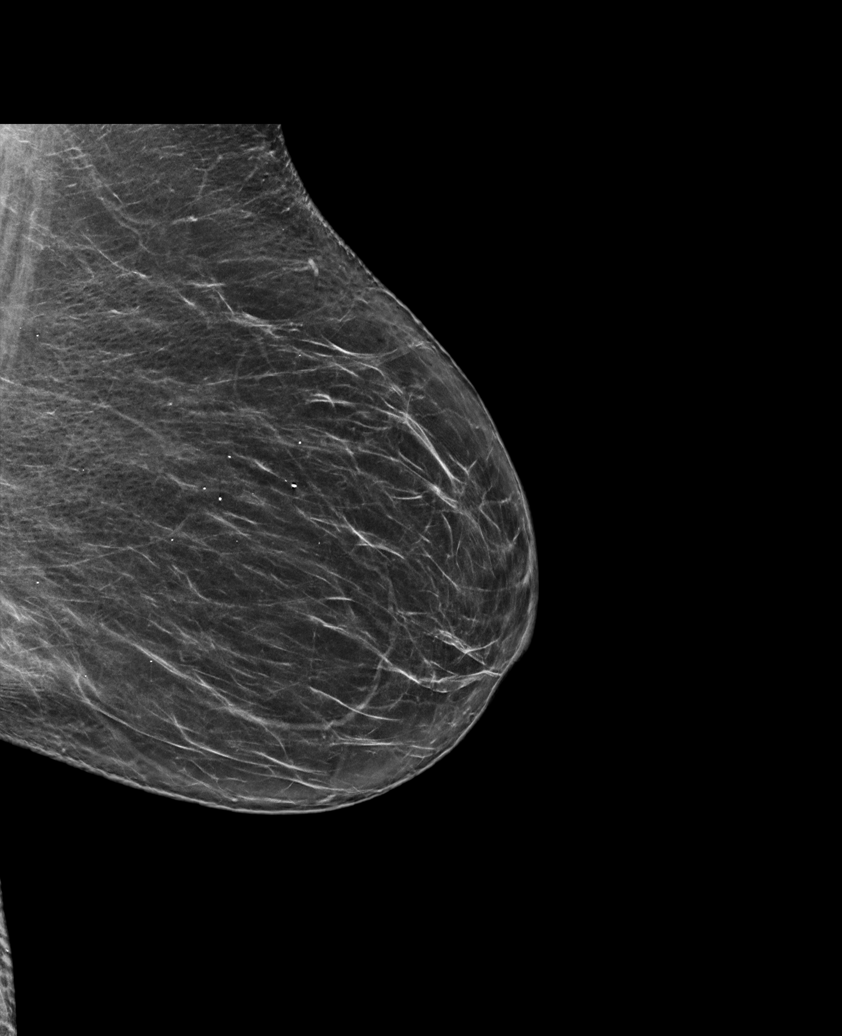

[L CC synth-2D]
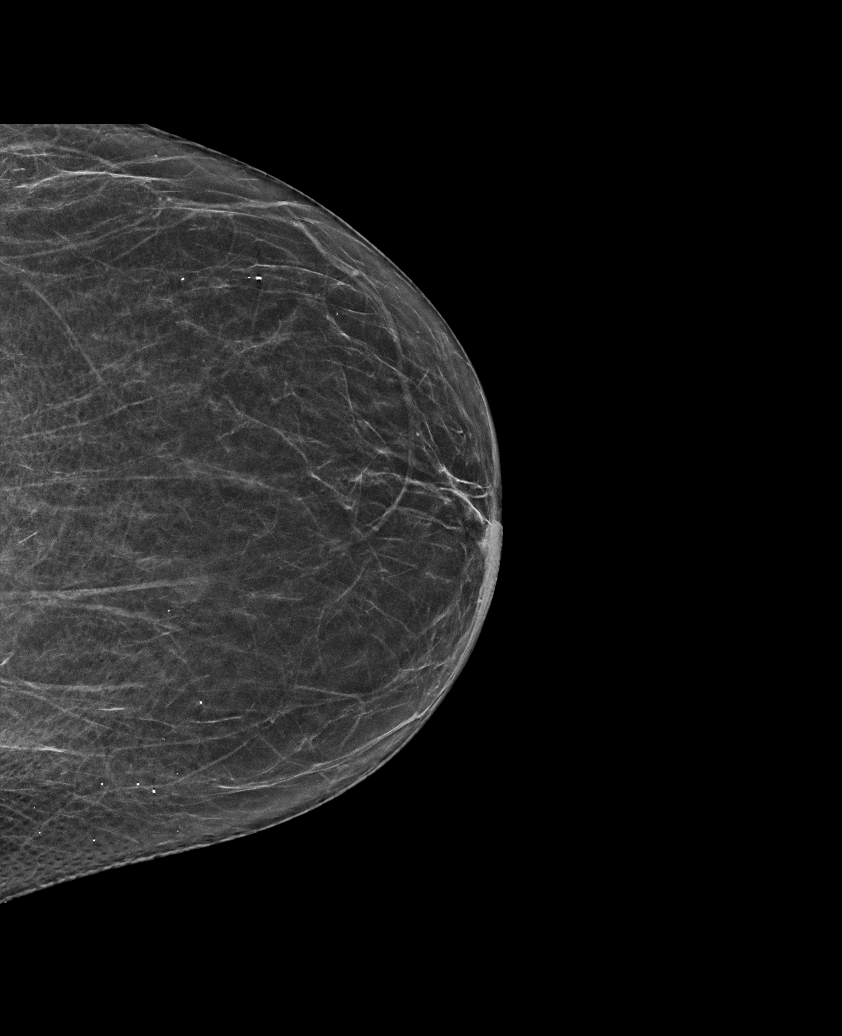

[R CC synth-2D]
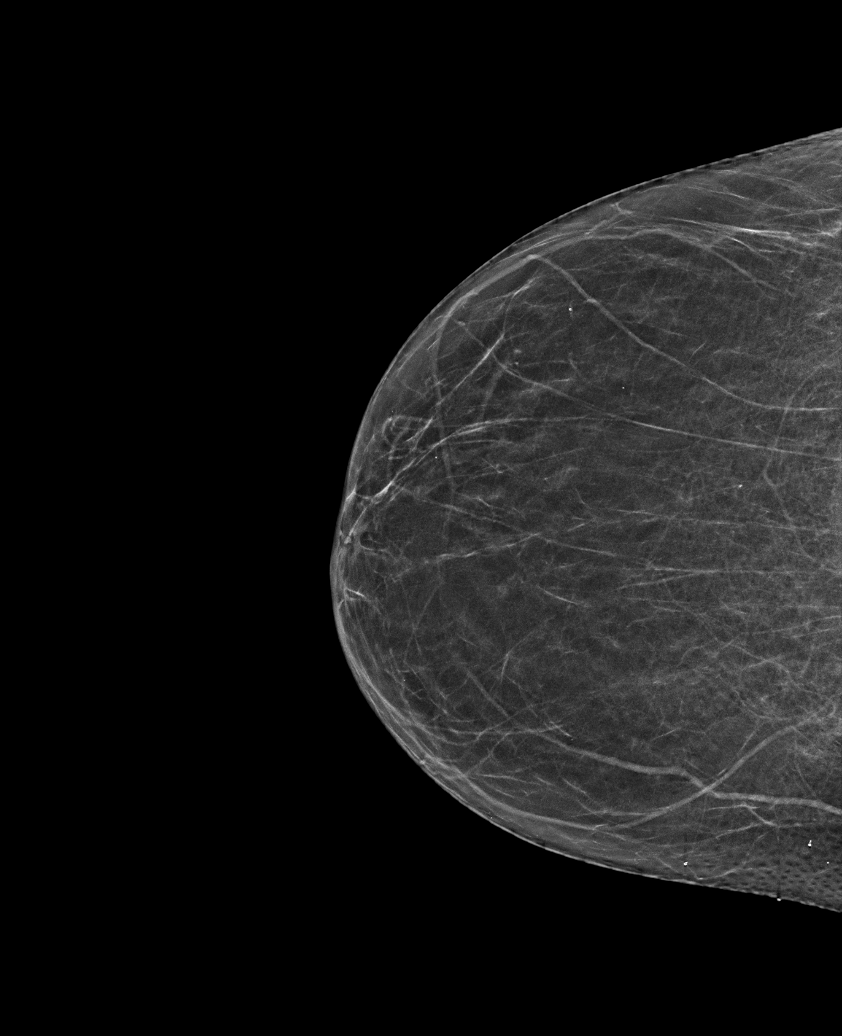

[R MLO synth-2D (2 of 2)]
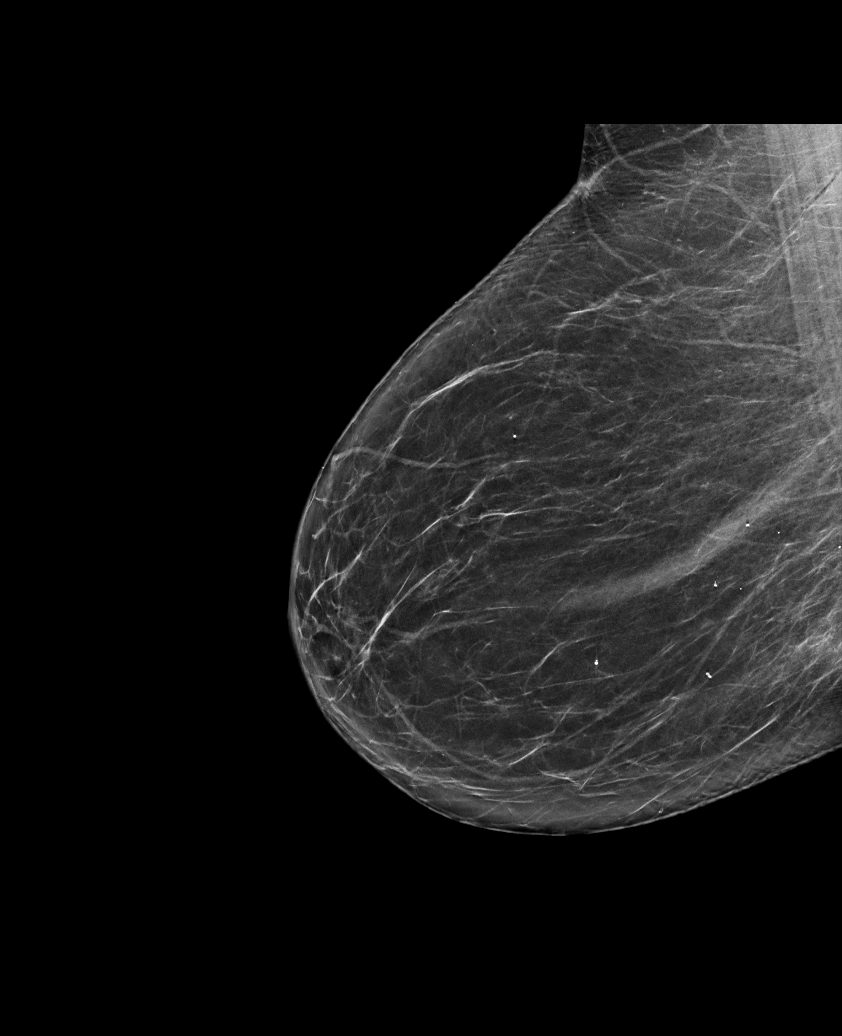

[L MLO tomo · tomo slice 35/70.0]
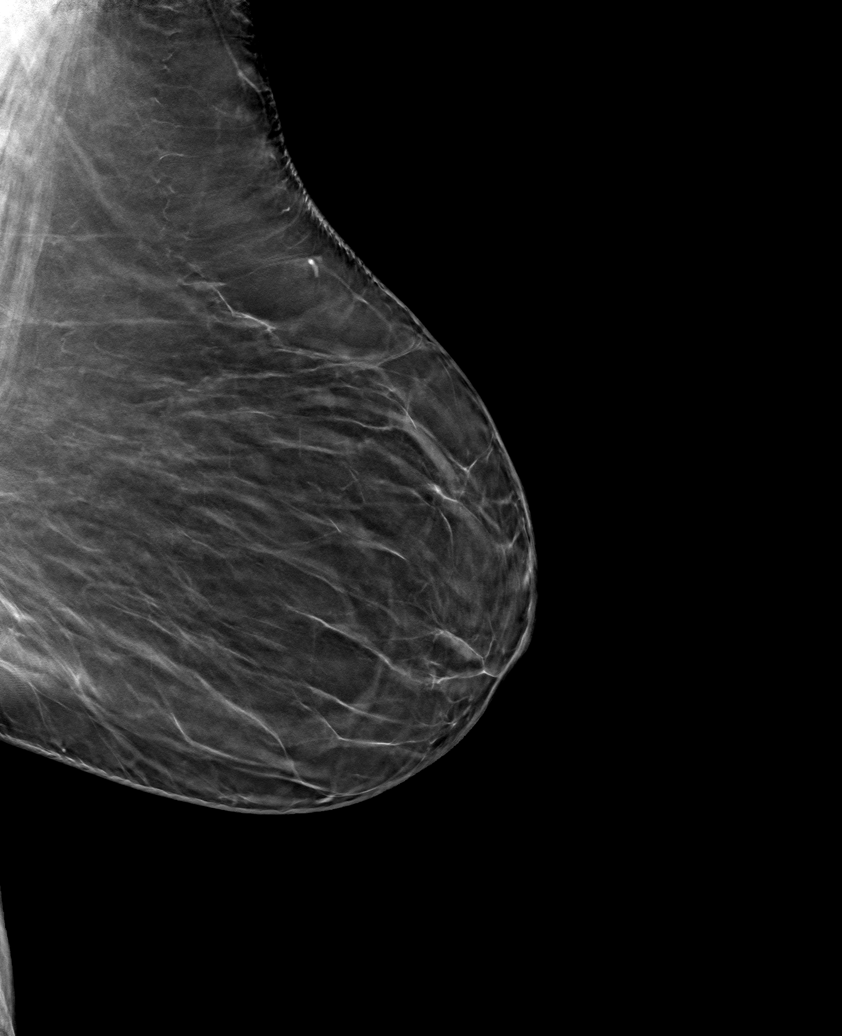

[6 of 30 positions shown; findings below may reference images not displayed]

ACR Breast Density Category b: There are scattered areas of
fibroglandular density.
FINDINGS: There are no findings suspicious for malignancy. Images were
processed with CAD.
IMPRESSION: No mammographic evidence of malignancy. A result letter of this
screening mammogram will be mailed directly to the patient.

RECOMMENDATION:
Screening mammogram in one year. (Code:[id number])

BI-RADS CATEGORY  1: Negative.

## 2020-09-15 DIAGNOSIS — L578 Other skin changes due to chronic exposure to nonionizing radiation: Secondary | ICD-10-CM | POA: Diagnosis not present

## 2020-09-15 DIAGNOSIS — L821 Other seborrheic keratosis: Secondary | ICD-10-CM | POA: Diagnosis not present

## 2020-10-01 DIAGNOSIS — R110 Nausea: Secondary | ICD-10-CM | POA: Diagnosis not present

## 2020-10-01 DIAGNOSIS — Z86010 Personal history of colonic polyps: Secondary | ICD-10-CM | POA: Diagnosis not present

## 2020-10-01 DIAGNOSIS — K760 Fatty (change of) liver, not elsewhere classified: Secondary | ICD-10-CM | POA: Diagnosis not present

## 2020-10-21 DIAGNOSIS — Z1389 Encounter for screening for other disorder: Secondary | ICD-10-CM | POA: Diagnosis not present

## 2020-10-21 DIAGNOSIS — Z0000 Encounter for general adult medical examination without abnormal findings: Secondary | ICD-10-CM | POA: Diagnosis not present

## 2020-11-11 DIAGNOSIS — D649 Anemia, unspecified: Secondary | ICD-10-CM | POA: Diagnosis not present

## 2020-11-11 DIAGNOSIS — E1165 Type 2 diabetes mellitus with hyperglycemia: Secondary | ICD-10-CM | POA: Diagnosis not present

## 2020-11-11 DIAGNOSIS — N1831 Chronic kidney disease, stage 3a: Secondary | ICD-10-CM | POA: Diagnosis not present

## 2020-11-11 DIAGNOSIS — Z7984 Long term (current) use of oral hypoglycemic drugs: Secondary | ICD-10-CM | POA: Diagnosis not present

## 2020-11-11 DIAGNOSIS — E785 Hyperlipidemia, unspecified: Secondary | ICD-10-CM | POA: Diagnosis not present

## 2020-11-11 DIAGNOSIS — E559 Vitamin D deficiency, unspecified: Secondary | ICD-10-CM | POA: Diagnosis not present

## 2020-11-11 DIAGNOSIS — I10 Essential (primary) hypertension: Secondary | ICD-10-CM | POA: Diagnosis not present

## 2020-11-11 DIAGNOSIS — K760 Fatty (change of) liver, not elsewhere classified: Secondary | ICD-10-CM | POA: Diagnosis not present

## 2020-11-11 DIAGNOSIS — Z6833 Body mass index (BMI) 33.0-33.9, adult: Secondary | ICD-10-CM | POA: Diagnosis not present

## 2020-11-11 DIAGNOSIS — E6609 Other obesity due to excess calories: Secondary | ICD-10-CM | POA: Diagnosis not present

## 2020-11-11 DIAGNOSIS — E538 Deficiency of other specified B group vitamins: Secondary | ICD-10-CM | POA: Diagnosis not present

## 2020-11-22 DIAGNOSIS — Z6833 Body mass index (BMI) 33.0-33.9, adult: Secondary | ICD-10-CM | POA: Diagnosis not present

## 2020-11-22 DIAGNOSIS — D649 Anemia, unspecified: Secondary | ICD-10-CM | POA: Diagnosis not present

## 2020-11-22 DIAGNOSIS — J45909 Unspecified asthma, uncomplicated: Secondary | ICD-10-CM | POA: Diagnosis not present

## 2020-11-22 DIAGNOSIS — I10 Essential (primary) hypertension: Secondary | ICD-10-CM | POA: Diagnosis not present

## 2020-11-22 DIAGNOSIS — K219 Gastro-esophageal reflux disease without esophagitis: Secondary | ICD-10-CM | POA: Diagnosis not present

## 2020-11-22 DIAGNOSIS — E1165 Type 2 diabetes mellitus with hyperglycemia: Secondary | ICD-10-CM | POA: Diagnosis not present

## 2020-11-22 DIAGNOSIS — E785 Hyperlipidemia, unspecified: Secondary | ICD-10-CM | POA: Diagnosis not present

## 2020-11-29 DIAGNOSIS — Z01812 Encounter for preprocedural laboratory examination: Secondary | ICD-10-CM | POA: Diagnosis not present

## 2020-12-02 DIAGNOSIS — D123 Benign neoplasm of transverse colon: Secondary | ICD-10-CM | POA: Diagnosis not present

## 2020-12-02 DIAGNOSIS — Z86010 Personal history of colonic polyps: Secondary | ICD-10-CM | POA: Diagnosis not present

## 2020-12-02 DIAGNOSIS — K5730 Diverticulosis of large intestine without perforation or abscess without bleeding: Secondary | ICD-10-CM | POA: Diagnosis not present

## 2020-12-02 DIAGNOSIS — K648 Other hemorrhoids: Secondary | ICD-10-CM | POA: Diagnosis not present

## 2020-12-06 DIAGNOSIS — D123 Benign neoplasm of transverse colon: Secondary | ICD-10-CM | POA: Diagnosis not present

## 2020-12-27 DIAGNOSIS — Z01419 Encounter for gynecological examination (general) (routine) without abnormal findings: Secondary | ICD-10-CM | POA: Diagnosis not present

## 2021-01-05 DIAGNOSIS — W57XXXA Bitten or stung by nonvenomous insect and other nonvenomous arthropods, initial encounter: Secondary | ICD-10-CM | POA: Diagnosis not present

## 2021-01-05 DIAGNOSIS — S70362A Insect bite (nonvenomous), left thigh, initial encounter: Secondary | ICD-10-CM | POA: Diagnosis not present

## 2021-01-27 DIAGNOSIS — I10 Essential (primary) hypertension: Secondary | ICD-10-CM | POA: Diagnosis not present

## 2021-01-27 DIAGNOSIS — E1165 Type 2 diabetes mellitus with hyperglycemia: Secondary | ICD-10-CM | POA: Diagnosis not present

## 2021-01-27 DIAGNOSIS — E785 Hyperlipidemia, unspecified: Secondary | ICD-10-CM | POA: Diagnosis not present

## 2021-01-27 DIAGNOSIS — J45909 Unspecified asthma, uncomplicated: Secondary | ICD-10-CM | POA: Diagnosis not present

## 2021-01-27 DIAGNOSIS — D649 Anemia, unspecified: Secondary | ICD-10-CM | POA: Diagnosis not present

## 2021-01-27 DIAGNOSIS — K219 Gastro-esophageal reflux disease without esophagitis: Secondary | ICD-10-CM | POA: Diagnosis not present

## 2021-01-27 DIAGNOSIS — N1831 Chronic kidney disease, stage 3a: Secondary | ICD-10-CM | POA: Diagnosis not present

## 2021-02-11 DIAGNOSIS — E1165 Type 2 diabetes mellitus with hyperglycemia: Secondary | ICD-10-CM | POA: Diagnosis not present

## 2021-02-11 DIAGNOSIS — N1831 Chronic kidney disease, stage 3a: Secondary | ICD-10-CM | POA: Diagnosis not present

## 2021-02-11 DIAGNOSIS — K219 Gastro-esophageal reflux disease without esophagitis: Secondary | ICD-10-CM | POA: Diagnosis not present

## 2021-02-11 DIAGNOSIS — E785 Hyperlipidemia, unspecified: Secondary | ICD-10-CM | POA: Diagnosis not present

## 2021-02-11 DIAGNOSIS — E559 Vitamin D deficiency, unspecified: Secondary | ICD-10-CM | POA: Diagnosis not present

## 2021-02-11 DIAGNOSIS — E538 Deficiency of other specified B group vitamins: Secondary | ICD-10-CM | POA: Diagnosis not present

## 2021-02-11 DIAGNOSIS — I10 Essential (primary) hypertension: Secondary | ICD-10-CM | POA: Diagnosis not present

## 2021-03-16 ENCOUNTER — Other Ambulatory Visit: Payer: Self-pay | Admitting: Family Medicine

## 2021-03-16 DIAGNOSIS — Z1231 Encounter for screening mammogram for malignant neoplasm of breast: Secondary | ICD-10-CM

## 2021-03-24 DIAGNOSIS — M720 Palmar fascial fibromatosis [Dupuytren]: Secondary | ICD-10-CM | POA: Diagnosis not present

## 2021-04-05 DIAGNOSIS — I10 Essential (primary) hypertension: Secondary | ICD-10-CM | POA: Diagnosis not present

## 2021-04-05 DIAGNOSIS — E785 Hyperlipidemia, unspecified: Secondary | ICD-10-CM | POA: Diagnosis not present

## 2021-04-05 DIAGNOSIS — K219 Gastro-esophageal reflux disease without esophagitis: Secondary | ICD-10-CM | POA: Diagnosis not present

## 2021-04-05 DIAGNOSIS — J45909 Unspecified asthma, uncomplicated: Secondary | ICD-10-CM | POA: Diagnosis not present

## 2021-04-05 DIAGNOSIS — D649 Anemia, unspecified: Secondary | ICD-10-CM | POA: Diagnosis not present

## 2021-04-05 DIAGNOSIS — E1165 Type 2 diabetes mellitus with hyperglycemia: Secondary | ICD-10-CM | POA: Diagnosis not present

## 2021-04-05 DIAGNOSIS — N1831 Chronic kidney disease, stage 3a: Secondary | ICD-10-CM | POA: Diagnosis not present

## 2021-05-04 DIAGNOSIS — H40013 Open angle with borderline findings, low risk, bilateral: Secondary | ICD-10-CM | POA: Diagnosis not present

## 2021-05-04 DIAGNOSIS — H43813 Vitreous degeneration, bilateral: Secondary | ICD-10-CM | POA: Diagnosis not present

## 2021-05-04 DIAGNOSIS — H18593 Other hereditary corneal dystrophies, bilateral: Secondary | ICD-10-CM | POA: Diagnosis not present

## 2021-05-04 DIAGNOSIS — E119 Type 2 diabetes mellitus without complications: Secondary | ICD-10-CM | POA: Diagnosis not present

## 2021-05-10 DIAGNOSIS — L570 Actinic keratosis: Secondary | ICD-10-CM | POA: Diagnosis not present

## 2021-05-10 DIAGNOSIS — L821 Other seborrheic keratosis: Secondary | ICD-10-CM | POA: Diagnosis not present

## 2021-05-11 ENCOUNTER — Ambulatory Visit
Admission: RE | Admit: 2021-05-11 | Discharge: 2021-05-11 | Disposition: A | Discharge status: Home / Self Care | Payer: Federal, State, Local not specified - PPO | Source: Ambulatory Visit | Attending: Family Medicine | Admitting: Family Medicine

## 2021-05-11 ENCOUNTER — Other Ambulatory Visit: Payer: Self-pay

## 2021-05-11 DIAGNOSIS — Z1231 Encounter for screening mammogram for malignant neoplasm of breast: Secondary | ICD-10-CM

## 2021-05-16 ENCOUNTER — Other Ambulatory Visit: Payer: Self-pay | Admitting: Cardiovascular Disease

## 2021-05-25 DIAGNOSIS — J45909 Unspecified asthma, uncomplicated: Secondary | ICD-10-CM | POA: Diagnosis not present

## 2021-05-25 DIAGNOSIS — E785 Hyperlipidemia, unspecified: Secondary | ICD-10-CM | POA: Diagnosis not present

## 2021-05-25 DIAGNOSIS — Z6833 Body mass index (BMI) 33.0-33.9, adult: Secondary | ICD-10-CM | POA: Diagnosis not present

## 2021-05-25 DIAGNOSIS — E1165 Type 2 diabetes mellitus with hyperglycemia: Secondary | ICD-10-CM | POA: Diagnosis not present

## 2021-05-25 DIAGNOSIS — K219 Gastro-esophageal reflux disease without esophagitis: Secondary | ICD-10-CM | POA: Diagnosis not present

## 2021-05-25 DIAGNOSIS — I10 Essential (primary) hypertension: Secondary | ICD-10-CM | POA: Diagnosis not present

## 2021-05-25 DIAGNOSIS — D649 Anemia, unspecified: Secondary | ICD-10-CM | POA: Diagnosis not present

## 2021-05-27 DIAGNOSIS — R140 Abdominal distension (gaseous): Secondary | ICD-10-CM | POA: Diagnosis not present

## 2021-05-27 DIAGNOSIS — Z86010 Personal history of colonic polyps: Secondary | ICD-10-CM | POA: Diagnosis not present

## 2021-05-27 DIAGNOSIS — K219 Gastro-esophageal reflux disease without esophagitis: Secondary | ICD-10-CM | POA: Diagnosis not present

## 2021-05-30 ENCOUNTER — Other Ambulatory Visit: Payer: Self-pay | Admitting: Cardiovascular Disease

## 2021-06-01 DIAGNOSIS — D649 Anemia, unspecified: Secondary | ICD-10-CM | POA: Diagnosis not present

## 2021-06-01 DIAGNOSIS — J45909 Unspecified asthma, uncomplicated: Secondary | ICD-10-CM | POA: Diagnosis not present

## 2021-06-01 DIAGNOSIS — E1165 Type 2 diabetes mellitus with hyperglycemia: Secondary | ICD-10-CM | POA: Diagnosis not present

## 2021-06-01 DIAGNOSIS — K219 Gastro-esophageal reflux disease without esophagitis: Secondary | ICD-10-CM | POA: Diagnosis not present

## 2021-06-01 DIAGNOSIS — N1831 Chronic kidney disease, stage 3a: Secondary | ICD-10-CM | POA: Diagnosis not present

## 2021-06-01 DIAGNOSIS — E785 Hyperlipidemia, unspecified: Secondary | ICD-10-CM | POA: Diagnosis not present

## 2021-06-01 DIAGNOSIS — I10 Essential (primary) hypertension: Secondary | ICD-10-CM | POA: Diagnosis not present

## 2021-06-07 DIAGNOSIS — J45909 Unspecified asthma, uncomplicated: Secondary | ICD-10-CM | POA: Diagnosis not present

## 2021-06-07 DIAGNOSIS — I10 Essential (primary) hypertension: Secondary | ICD-10-CM | POA: Diagnosis not present

## 2021-06-07 DIAGNOSIS — E785 Hyperlipidemia, unspecified: Secondary | ICD-10-CM | POA: Diagnosis not present

## 2021-06-07 DIAGNOSIS — E1165 Type 2 diabetes mellitus with hyperglycemia: Secondary | ICD-10-CM | POA: Diagnosis not present

## 2021-06-07 DIAGNOSIS — K219 Gastro-esophageal reflux disease without esophagitis: Secondary | ICD-10-CM | POA: Diagnosis not present

## 2021-06-07 DIAGNOSIS — N1831 Chronic kidney disease, stage 3a: Secondary | ICD-10-CM | POA: Diagnosis not present

## 2021-06-07 DIAGNOSIS — D649 Anemia, unspecified: Secondary | ICD-10-CM | POA: Diagnosis not present

## 2021-06-10 ENCOUNTER — Ambulatory Visit: Payer: Federal, State, Local not specified - PPO | Attending: Internal Medicine

## 2021-06-10 DIAGNOSIS — Z23 Encounter for immunization: Secondary | ICD-10-CM

## 2021-06-10 NOTE — Progress Notes (Signed)
   Covid-19 Vaccination Clinic  Name:  Tiffany Weeks    MRN: [id number] DOB: 02-05-1951  06/10/2021  Tiffany Weeks was observed post Covid-19 immunization for 15 minutes without incident. She was provided with Vaccine Information Sheet and instruction to access the V-Safe system.   Tiffany Weeks was instructed to call 911 with any severe reactions post vaccine: Difficulty breathing  Swelling of face and throat  A fast heartbeat  A bad rash all over body  Dizziness and weakness   Immunizations Administered     Name Date Dose VIS Date Route   PFIZER Comrnaty(Gray TOP) Covid-19 Vaccine 06/10/2021  1:21 PM 0.3 mL 09/23/2020 Intramuscular   Manufacturer: [place]   Lot: [id number]   [place]: [phone]

## 2021-06-17 ENCOUNTER — Other Ambulatory Visit: Payer: Self-pay | Admitting: Cardiovascular Disease

## 2021-06-23 DIAGNOSIS — M1811 Unilateral primary osteoarthritis of first carpometacarpal joint, right hand: Secondary | ICD-10-CM | POA: Diagnosis not present

## 2021-06-23 DIAGNOSIS — M65342 Trigger finger, left ring finger: Secondary | ICD-10-CM | POA: Diagnosis not present

## 2021-06-23 DIAGNOSIS — M79642 Pain in left hand: Secondary | ICD-10-CM | POA: Diagnosis not present

## 2021-06-23 DIAGNOSIS — M720 Palmar fascial fibromatosis [Dupuytren]: Secondary | ICD-10-CM | POA: Diagnosis not present

## 2021-06-27 ENCOUNTER — Other Ambulatory Visit (HOSPITAL_BASED_OUTPATIENT_CLINIC_OR_DEPARTMENT_OTHER): Payer: Self-pay

## 2021-06-27 DIAGNOSIS — Z23 Encounter for immunization: Secondary | ICD-10-CM | POA: Diagnosis not present

## 2021-06-27 MED ORDER — COVID-19 MRNA VAC-TRIS(PFIZER) 30 MCG/0.3ML IM SUSP
INTRAMUSCULAR | 0 refills | Status: DC
  Filled 2021-06-27: qty 0.3, 1d supply, fill #0

## 2021-07-04 ENCOUNTER — Other Ambulatory Visit: Payer: Self-pay | Admitting: Cardiovascular Disease

## 2021-07-11 ENCOUNTER — Telehealth: Payer: Self-pay

## 2021-07-11 ENCOUNTER — Other Ambulatory Visit: Payer: Self-pay

## 2021-07-11 NOTE — Telephone Encounter (Signed)
Left message for patient to call back  

## 2021-07-11 NOTE — Telephone Encounter (Signed)
-----   Message from Josue Hector, MD sent at 07/11/2021  2:17 PM EDT ----- Regarding: RE: Medication Question No ok to refill it ----- Message ----- From: Judie Grieve Sent: 07/11/2021  12:30 PM EDT To: Josue Hector, MD Subject: Medication Question                            Good afternoon,  This patient has been informed by [facility] that she needs an appt with you before they will refill her Amlodipine Besylate.  Is that true?  If not is it possible to send one in for her?  She does mychart but is requesting a call from someone regarding this issue.  Thank you!

## 2021-07-12 MED ORDER — AMLODIPINE BESYLATE 5 MG PO TABS: ORAL_TABLET | ORAL | 0 refills | Status: DC

## 2021-07-12 NOTE — Telephone Encounter (Signed)
Pt is returning call.  

## 2021-07-12 NOTE — Telephone Encounter (Signed)
Pt aware office will send 30 day Rx w/ no refills. Pt PRN f/u with our office, so another provider should be providing refills going forward. Advised to have PCP, Dr. Jacelyn Grip, to send in Rx going forward. Pt will call PCP office this week to arrange. She appreciates our help with this.

## 2021-07-16 ENCOUNTER — Other Ambulatory Visit: Payer: Self-pay | Admitting: Cardiovascular Disease

## 2021-07-21 DIAGNOSIS — M65342 Trigger finger, left ring finger: Secondary | ICD-10-CM | POA: Diagnosis not present

## 2021-07-21 DIAGNOSIS — M65849 Other synovitis and tenosynovitis, unspecified hand: Secondary | ICD-10-CM | POA: Diagnosis not present

## 2021-07-21 DIAGNOSIS — M1811 Unilateral primary osteoarthritis of first carpometacarpal joint, right hand: Secondary | ICD-10-CM | POA: Diagnosis not present

## 2021-07-21 DIAGNOSIS — M13849 Other specified arthritis, unspecified hand: Secondary | ICD-10-CM | POA: Diagnosis not present

## 2021-07-21 DIAGNOSIS — M720 Palmar fascial fibromatosis [Dupuytren]: Secondary | ICD-10-CM | POA: Diagnosis not present

## 2021-07-21 DIAGNOSIS — M79642 Pain in left hand: Secondary | ICD-10-CM | POA: Diagnosis not present

## 2021-08-01 DIAGNOSIS — I10 Essential (primary) hypertension: Secondary | ICD-10-CM | POA: Diagnosis not present

## 2021-08-01 DIAGNOSIS — K219 Gastro-esophageal reflux disease without esophagitis: Secondary | ICD-10-CM | POA: Diagnosis not present

## 2021-08-01 DIAGNOSIS — E1165 Type 2 diabetes mellitus with hyperglycemia: Secondary | ICD-10-CM | POA: Diagnosis not present

## 2021-08-01 DIAGNOSIS — J45909 Unspecified asthma, uncomplicated: Secondary | ICD-10-CM | POA: Diagnosis not present

## 2021-08-01 DIAGNOSIS — D649 Anemia, unspecified: Secondary | ICD-10-CM | POA: Diagnosis not present

## 2021-08-01 DIAGNOSIS — E785 Hyperlipidemia, unspecified: Secondary | ICD-10-CM | POA: Diagnosis not present

## 2021-08-01 DIAGNOSIS — N1831 Chronic kidney disease, stage 3a: Secondary | ICD-10-CM | POA: Diagnosis not present

## 2021-08-06 ENCOUNTER — Other Ambulatory Visit: Payer: Self-pay | Admitting: Cardiology

## 2021-08-12 ENCOUNTER — Other Ambulatory Visit (HOSPITAL_BASED_OUTPATIENT_CLINIC_OR_DEPARTMENT_OTHER): Payer: Self-pay

## 2021-08-12 MED ORDER — INFLUENZA VAC A&B SA ADJ QUAD 0.5 ML IM PRSY
PREFILLED_SYRINGE | INTRAMUSCULAR | 0 refills | Status: DC
  Filled 2021-08-12: qty 0.5, 1d supply, fill #0

## 2021-08-16 ENCOUNTER — Emergency Department (HOSPITAL_BASED_OUTPATIENT_CLINIC_OR_DEPARTMENT_OTHER)
Admission: EM | Admit: 2021-08-16 | Discharge: 2021-08-16 | Disposition: A | Discharge status: Home / Self Care | Payer: Medicare Other | Attending: Emergency Medicine | Admitting: Emergency Medicine

## 2021-08-16 ENCOUNTER — Emergency Department (HOSPITAL_BASED_OUTPATIENT_CLINIC_OR_DEPARTMENT_OTHER): Payer: Medicare Other

## 2021-08-16 ENCOUNTER — Encounter (HOSPITAL_BASED_OUTPATIENT_CLINIC_OR_DEPARTMENT_OTHER): Payer: Self-pay

## 2021-08-16 DIAGNOSIS — I10 Essential (primary) hypertension: Secondary | ICD-10-CM | POA: Insufficient documentation

## 2021-08-16 DIAGNOSIS — Z79899 Other long term (current) drug therapy: Secondary | ICD-10-CM | POA: Diagnosis not present

## 2021-08-16 DIAGNOSIS — N202 Calculus of kidney with calculus of ureter: Secondary | ICD-10-CM | POA: Diagnosis not present

## 2021-08-16 DIAGNOSIS — E1169 Type 2 diabetes mellitus with other specified complication: Secondary | ICD-10-CM | POA: Diagnosis not present

## 2021-08-16 DIAGNOSIS — J45909 Unspecified asthma, uncomplicated: Secondary | ICD-10-CM | POA: Insufficient documentation

## 2021-08-16 DIAGNOSIS — R109 Unspecified abdominal pain: Secondary | ICD-10-CM | POA: Diagnosis not present

## 2021-08-16 DIAGNOSIS — Z7984 Long term (current) use of oral hypoglycemic drugs: Secondary | ICD-10-CM | POA: Diagnosis not present

## 2021-08-16 DIAGNOSIS — Z7982 Long term (current) use of aspirin: Secondary | ICD-10-CM | POA: Diagnosis not present

## 2021-08-16 DIAGNOSIS — N200 Calculus of kidney: Secondary | ICD-10-CM | POA: Diagnosis not present

## 2021-08-16 DIAGNOSIS — R110 Nausea: Secondary | ICD-10-CM | POA: Insufficient documentation

## 2021-08-16 DIAGNOSIS — E785 Hyperlipidemia, unspecified: Secondary | ICD-10-CM | POA: Insufficient documentation

## 2021-08-16 DIAGNOSIS — Z20822 Contact with and (suspected) exposure to covid-19: Secondary | ICD-10-CM | POA: Diagnosis not present

## 2021-08-16 LAB — URINALYSIS, ROUTINE W REFLEX MICROSCOPIC
Bilirubin Urine: NEGATIVE
Glucose, UA: >1000 mg/dL — AB
Ketones, ur: 40 mg/dL — AB
Leukocytes,Ua: NEGATIVE
Nitrite: NEGATIVE
Protein, ur: NEGATIVE mg/dL
RBC / HPF: >50 RBC/hpf — ABNORMAL HIGH (ref 0–5)
Specific Gravity, Urine: 1.015 (ref 1.005–1.030)
pH: 5 (ref 5.0–8.0)

## 2021-08-16 LAB — CBC WITH DIFFERENTIAL/PLATELET
Abs Immature Granulocytes: 0.04 10*3/uL (ref 0.00–0.07)
Basophils Absolute: 0.1 10*3/uL (ref 0.0–0.1)
Basophils Relative: 1 %
Eosinophils Absolute: 0.4 10*3/uL (ref 0.0–0.5)
Eosinophils Relative: 3 %
HCT: 34.7 % — ABNORMAL LOW (ref 36.0–46.0)
Hemoglobin: 11.5 g/dL — ABNORMAL LOW (ref 12.0–15.0)
Immature Granulocytes: 0 %
Lymphocytes Relative: 9 %
Lymphs Abs: 1 10*3/uL (ref 0.7–4.0)
MCH: 30.7 pg (ref 26.0–34.0)
MCHC: 33.1 g/dL (ref 30.0–36.0)
MCV: 92.5 fL (ref 80.0–100.0)
Monocytes Absolute: 0.5 10*3/uL (ref 0.1–1.0)
Monocytes Relative: 5 %
Neutro Abs: 9.3 10*3/uL — ABNORMAL HIGH (ref 1.7–7.7)
Neutrophils Relative %: 82 %
Platelets: 242 10*3/uL (ref 150–400)
RBC: 3.75 MIL/uL — ABNORMAL LOW (ref 3.87–5.11)
RDW: 13 % (ref 11.5–15.5)
WBC: 11.3 10*3/uL — ABNORMAL HIGH (ref 4.0–10.5)
nRBC: 0 % (ref 0.0–0.2)

## 2021-08-16 LAB — BASIC METABOLIC PANEL
Anion gap: 14 (ref 5–15)
BUN: 21 mg/dL (ref 8–23)
CO2: 22 mmol/L (ref 22–32)
Calcium: 9.3 mg/dL (ref 8.9–10.3)
Chloride: 99 mmol/L (ref 98–111)
Creatinine, Ser: 1.11 mg/dL — ABNORMAL HIGH (ref 0.44–1.00)
GFR, Estimated: 53 mL/min — ABNORMAL LOW (ref >60)
Glucose, Bld: 250 mg/dL — ABNORMAL HIGH (ref 70–99)
Potassium: 4.1 mmol/L (ref 3.5–5.1)
Sodium: 135 mmol/L (ref 135–145)

## 2021-08-16 LAB — RESP PANEL BY RT-PCR (FLU A&B, COVID) ARPGX2
Influenza A by PCR: NEGATIVE
Influenza B by PCR: NEGATIVE
SARS Coronavirus 2 by RT PCR: NEGATIVE

## 2021-08-16 MED ORDER — TRAMADOL HCL 50 MG PO TABS: 50.0000 mg | ORAL_TABLET | Freq: Four times a day (QID) | ORAL | 0 refills | Status: DC | PRN

## 2021-08-16 MED ORDER — IBUPROFEN 800 MG PO TABS: 800.0000 mg | ORAL_TABLET | Freq: Three times a day (TID) | ORAL | 0 refills | Status: DC

## 2021-08-16 MED ORDER — ONDANSETRON 8 MG PO TBDP
ORAL_TABLET | ORAL | 0 refills | Status: DC
Start: 2021-08-16 — End: 2021-12-16

## 2021-08-16 MED ORDER — ONDANSETRON HCL 4 MG/2ML IJ SOLN
4.0000 mg | Freq: Once | INTRAMUSCULAR | Status: AC
  Administered 2021-08-16: 4 mg via INTRAVENOUS
  Filled 2021-08-16: qty 2

## 2021-08-16 MED ORDER — ACETAMINOPHEN 500 MG PO TABS
1000.0000 mg | ORAL_TABLET | Freq: Once | ORAL | Status: AC
  Administered 2021-08-16: 1000 mg via ORAL
  Filled 2021-08-16: qty 2

## 2021-08-16 MED ORDER — TAMSULOSIN HCL 0.4 MG PO CAPS: 0.4000 mg | ORAL_CAPSULE | Freq: Every day | ORAL | 0 refills | Status: DC

## 2021-08-16 MED ORDER — TAMSULOSIN HCL 0.4 MG PO CAPS
0.4000 mg | ORAL_CAPSULE | ORAL | Status: AC
  Administered 2021-08-16: 0.4 mg via ORAL
  Filled 2021-08-16: qty 1

## 2021-08-16 MED ORDER — ALUM & MAG HYDROXIDE-SIMETH 200-200-20 MG/5ML PO SUSP
30.0000 mL | Freq: Once | ORAL | Status: AC
  Administered 2021-08-16: 30 mL via ORAL
  Filled 2021-08-16: qty 30

## 2021-08-16 NOTE — ED Triage Notes (Signed)
Pt states that she has L sided flank pain and LLQ abdominal pain that started this morning at 0100. Pt states that it radiated to the front of her abdomen and occasionally in her pelvis. Pt states that she has been nauseous for three days. Endorses diarrhea.

## 2021-08-16 NOTE — ED Notes (Signed)
Pt vomiting after Tylenol admin

## 2021-08-16 NOTE — ED Provider Notes (Signed)
[place] EMERGENCY DEPT Provider Note   CSN: [id number] Arrival date & time: 08/16/21  0409     History Chief Complaint  Patient presents with   Abdominal Pain   Flank Pain    Tiffany Weeks is a 70 y.o. female.  The history is provided by the patient.  Flank Pain This is a new problem. The current episode started 1 to 2 hours ago. The problem occurs constantly. The problem has not changed since onset.Pertinent negatives include no chest pain, no headaches and no shortness of breath. Nothing aggravates the symptoms. Nothing relieves the symptoms. She has tried nothing for the symptoms. The treatment provided no relief.  Sudden onset flank pain.  With nausea and dry heaves.      Past Medical History:  Diagnosis Date   Arthritis    Asthma    Back pain    DM (diabetes mellitus) ([place])    Glaucoma    Hyperlipidemia    Palpitation    Vitamin D deficiency     Patient Active Problem List   Diagnosis Date Noted   Osteoarthritis of finger of right hand 04/03/2016   Pain 04/03/2016   HTN (hypertension) 10/27/2013   Abnormal ECG 10/27/2013   Chest pain 04/28/2013   Asthma    Hyperlipidemia    DM (diabetes mellitus) ([place])    Glaucoma    Back pain    Vitamin D deficiency    Arthritis    Palpitation     Past Surgical History:  Procedure Laterality Date   cataract surgery each eye     CHOLECYSTECTOMY       OB History   No obstetric history on file.     Family History  Problem Relation Age of Onset   Diabetes Mother    Hypertension Mother    Heart failure Mother    Heart disease Mother    Liver disease Father    Liver disease Other    Aortic stenosis Other    Hyperlipidemia Other    Thyroid disease Other    Breast cancer Other    Breast cancer Sister 53    Social History   Tobacco Use   Smoking status: Never   Smokeless tobacco: Never  Substance Use Topics   Alcohol use: No   Drug use: No    Home Medications Prior to Admission  medications   Medication Sig Start Date End Date Taking? Authorizing Provider  amLODipine (NORVASC) 5 MG tablet TAKE 1 TABLET BY MOUTH EVERY EVENING - Pt needs appt before further refills - 3rd and final attempt 07/12/21   Constance Haw, MD  aspirin 81 MG tablet Take 81 mg by mouth daily.    [provider]  cholecalciferol (VITAMIN D) 1000 UNITS tablet Take 1,000 Units by mouth daily.    [provider]  COVID-19 mRNA Vac-TriS, Pfizer, SUSP injection Inject into the muscle. 06/10/21   Carlyle Basques, MD  ezetimibe-simvastatin (VYTORIN) 10-40 MG per tablet Take 1 tablet by mouth as directed.     [provider]  glimepiride (AMARYL) 1 MG tablet Take 1 tablet by mouth daily 09/29/14   [provider]  influenza vaccine adjuvanted (FLUAD) 0.5 ML injection Inject into the muscle. 08/12/21   Carlyle Basques, MD  metFORMIN (GLUCOPHAGE) 500 MG tablet Take 750 mg by mouth 2 (two) times daily with a meal.     [provider]  olmesartan (BENICAR) 40 MG tablet Take 1 tablet (40 mg total) by mouth daily.  Please keep upcoming appt for future refills. Thank you 02/15/18   Josue Hector, MD  Polyethyl Glycol-Propyl Glycol (SYSTANE OP) Place 1-2 drops into both eyes as directed.     [provider]    Allergies    Align [acidophilus], Bifidobacterium, Losartan, Naprosyn [naproxen], Amoxicillin, Ciprofloxacin hcl, and Tape  Review of Systems   Review of Systems  Constitutional:  Negative for fever.  HENT:  Negative for congestion.   Eyes:  Negative for redness.  Respiratory:  Negative for shortness of breath.   Cardiovascular:  Negative for chest pain.  Gastrointestinal:  Positive for nausea and vomiting.  Genitourinary:  Positive for flank pain. Negative for difficulty urinating.  Musculoskeletal:  Negative for neck pain.  Skin:  Negative for color change.  Neurological:  Negative for headaches.  Psychiatric/Behavioral:  Negative for  agitation.   All other systems reviewed and are negative.  Physical Exam Updated Vital Signs BP (!) 174/85 (BP Location: Right Arm)   Pulse 93   Temp 98.1 F (36.7 C) (Oral)   Resp 18   Ht 5\' 4"  (1.626 m)   Wt 84.4 kg   SpO2 100%   BMI 31.93 kg/m   Physical Exam Vitals and nursing note reviewed.  Constitutional:      General: She is not in acute distress.    Appearance: Normal appearance.  HENT:     Head: Normocephalic and atraumatic.     Nose: Nose normal.  Eyes:     Conjunctiva/sclera: Conjunctivae normal.     Pupils: Pupils are equal, round, and reactive to light.  Cardiovascular:     Rate and Rhythm: Normal rate and regular rhythm.     Pulses: Normal pulses.     Heart sounds: Normal heart sounds.  Pulmonary:     Effort: Pulmonary effort is normal.     Breath sounds: Normal breath sounds.  Abdominal:     General: Abdomen is flat. Bowel sounds are normal.     Palpations: Abdomen is soft.     Tenderness: There is no abdominal tenderness. There is no guarding.  Musculoskeletal:        General: Normal range of motion.     Cervical back: Normal range of motion and neck supple.  Skin:    General: Skin is warm and dry.     Capillary Refill: Capillary refill takes less than 2 seconds.  Neurological:     General: No focal deficit present.     Mental Status: She is alert and oriented to person, place, and time.     Deep Tendon Reflexes: Reflexes normal.  Psychiatric:        Mood and Affect: Mood normal.        Behavior: Behavior normal.    ED Results / Procedures / Treatments   Labs (all labs ordered are listed, but only abnormal results are displayed) Labs Reviewed  CBC WITH DIFFERENTIAL/PLATELET - Abnormal; Notable for the following components:      Result Value   WBC 11.3 (*)    RBC 3.75 (*)    Hemoglobin 11.5 (*)    HCT 34.7 (*)    Neutro Abs 9.3 (*)    All other components within normal limits  BASIC METABOLIC PANEL - Abnormal; Notable for the following  components:   Glucose, Bld 250 (*)    Creatinine, Ser 1.11 (*)    GFR, Estimated 53 (*)    All other components within normal limits  URINALYSIS, ROUTINE W REFLEX MICROSCOPIC - Abnormal;  Notable for the following components:   Glucose, UA >1,000 (*)    Hgb urine dipstick LARGE (*)    Ketones, ur 40 (*)    RBC / HPF >50 (*)    Bacteria, UA RARE (*)    All other components within normal limits  RESP PANEL BY RT-PCR (FLU A&B, COVID) [id number]    EKG None  Radiology No results found.  Procedures Procedures   Medications Ordered in ED Medications  acetaminophen (TYLENOL) tablet 1,000 mg (has no administration in time range)  ondansetron (ZOFRAN) injection 4 mg (4 mg Intravenous Given 08/16/21 0441)  alum & mag hydroxide-simeth (MAALOX/MYLANTA) 200-200-20 MG/5ML suspension 30 mL (30 mLs Oral Given 08/16/21 0457)  tamsulosin (FLOMAX) capsule 0.4 mg (0.4 mg Oral Given 08/16/21 0511)    ED Course  I have reviewed the triage vital signs and the nursing notes.  Pertinent labs & imaging results that were available during my care of the patient were reviewed by me and considered in my medical decision making (see chart for details).   No one to drive her home.  She would like an RX.  Will strain all urine and follow up with [facility].     Tiffany Weeks was evaluated in Emergency Department on 08/16/2021 for the symptoms described in the history of present illness. She was evaluated in the context of the global COVID-19 pandemic, which necessitated consideration that the patient might be at risk for infection with the SARS-CoV-2 virus that causes COVID-19. Institutional protocols and algorithms that pertain to the evaluation of patients at risk for COVID-19 are in a state of rapid change based on information released by regulatory bodies including the CDC and federal and state organizations. These policies and algorithms were followed during the patient's care in the ED.  Final Clinical  Impression(s) / ED Diagnoses Final diagnoses:  None  Return for intractable cough, coughing up blood, fevers > 100.4 unrelieved by medication, shortness of breath, intractable vomiting, chest pain, shortness of breath, weakness, numbness, changes in speech, facial asymmetry, abdominal pain, passing out, Inability to tolerate liquids or food, cough, altered mental status or any concerns. No signs of systemic illness or infection. The patient is nontoxic-appearing on exam and vital signs are within normal limits.  I have reviewed the triage vital signs and the nursing notes. Pertinent labs & imaging results that were available during my care of the patient were reviewed by me and considered in my medical decision making (see chart for details). After history, exam, and medical workup I feel the patient has been appropriately medically screened and is safe for discharge home. Pertinent diagnoses were discussed with the patient. Patient was given return precautions.   Rx / DC Orders ED Discharge Orders     None        Berry Godsey, MD 08/16/21 [phone]

## 2021-08-17 DIAGNOSIS — I10 Essential (primary) hypertension: Secondary | ICD-10-CM | POA: Diagnosis not present

## 2021-08-17 DIAGNOSIS — E785 Hyperlipidemia, unspecified: Secondary | ICD-10-CM | POA: Diagnosis not present

## 2021-08-17 DIAGNOSIS — D649 Anemia, unspecified: Secondary | ICD-10-CM | POA: Diagnosis not present

## 2021-08-17 DIAGNOSIS — K219 Gastro-esophageal reflux disease without esophagitis: Secondary | ICD-10-CM | POA: Diagnosis not present

## 2021-08-17 DIAGNOSIS — E1165 Type 2 diabetes mellitus with hyperglycemia: Secondary | ICD-10-CM | POA: Diagnosis not present

## 2021-08-17 DIAGNOSIS — N1831 Chronic kidney disease, stage 3a: Secondary | ICD-10-CM | POA: Diagnosis not present

## 2021-08-17 DIAGNOSIS — J45909 Unspecified asthma, uncomplicated: Secondary | ICD-10-CM | POA: Diagnosis not present

## 2021-08-22 ENCOUNTER — Other Ambulatory Visit: Payer: Self-pay | Admitting: Cardiology

## 2021-08-22 DIAGNOSIS — N202 Calculus of kidney with calculus of ureter: Secondary | ICD-10-CM | POA: Diagnosis not present

## 2021-08-23 NOTE — Telephone Encounter (Signed)
This is the patients third attempt and she has not contacted the office for an appointment.   Is it ok to refill the rx?   Should I send a message to scheduling to have them contact the patient to schedule an appointment?

## 2021-09-05 DIAGNOSIS — I700 Atherosclerosis of aorta: Secondary | ICD-10-CM | POA: Diagnosis not present

## 2021-09-05 DIAGNOSIS — R110 Nausea: Secondary | ICD-10-CM | POA: Diagnosis not present

## 2021-09-05 DIAGNOSIS — N200 Calculus of kidney: Secondary | ICD-10-CM | POA: Diagnosis not present

## 2021-09-05 DIAGNOSIS — Z9049 Acquired absence of other specified parts of digestive tract: Secondary | ICD-10-CM | POA: Diagnosis not present

## 2021-09-05 DIAGNOSIS — N202 Calculus of kidney with calculus of ureter: Secondary | ICD-10-CM | POA: Diagnosis not present

## 2021-09-28 DIAGNOSIS — K219 Gastro-esophageal reflux disease without esophagitis: Secondary | ICD-10-CM | POA: Diagnosis not present

## 2021-09-28 DIAGNOSIS — E785 Hyperlipidemia, unspecified: Secondary | ICD-10-CM | POA: Diagnosis not present

## 2021-09-28 DIAGNOSIS — E1165 Type 2 diabetes mellitus with hyperglycemia: Secondary | ICD-10-CM | POA: Diagnosis not present

## 2021-09-28 DIAGNOSIS — J45909 Unspecified asthma, uncomplicated: Secondary | ICD-10-CM | POA: Diagnosis not present

## 2021-09-28 DIAGNOSIS — I10 Essential (primary) hypertension: Secondary | ICD-10-CM | POA: Diagnosis not present

## 2021-09-28 DIAGNOSIS — N1831 Chronic kidney disease, stage 3a: Secondary | ICD-10-CM | POA: Diagnosis not present

## 2021-10-03 DIAGNOSIS — Z86010 Personal history of colonic polyps: Secondary | ICD-10-CM | POA: Diagnosis not present

## 2021-10-03 DIAGNOSIS — R140 Abdominal distension (gaseous): Secondary | ICD-10-CM | POA: Diagnosis not present

## 2021-10-03 DIAGNOSIS — K219 Gastro-esophageal reflux disease without esophagitis: Secondary | ICD-10-CM | POA: Diagnosis not present

## 2021-10-18 DIAGNOSIS — L821 Other seborrheic keratosis: Secondary | ICD-10-CM | POA: Diagnosis not present

## 2021-10-18 DIAGNOSIS — D485 Neoplasm of uncertain behavior of skin: Secondary | ICD-10-CM | POA: Diagnosis not present

## 2021-10-18 DIAGNOSIS — D0439 Carcinoma in situ of skin of other parts of face: Secondary | ICD-10-CM | POA: Diagnosis not present

## 2021-10-18 DIAGNOSIS — L570 Actinic keratosis: Secondary | ICD-10-CM | POA: Diagnosis not present

## 2021-10-24 DIAGNOSIS — E559 Vitamin D deficiency, unspecified: Secondary | ICD-10-CM | POA: Diagnosis not present

## 2021-10-24 DIAGNOSIS — Z7984 Long term (current) use of oral hypoglycemic drugs: Secondary | ICD-10-CM | POA: Diagnosis not present

## 2021-10-24 DIAGNOSIS — E1165 Type 2 diabetes mellitus with hyperglycemia: Secondary | ICD-10-CM | POA: Diagnosis not present

## 2021-10-24 DIAGNOSIS — Z1389 Encounter for screening for other disorder: Secondary | ICD-10-CM | POA: Diagnosis not present

## 2021-10-24 DIAGNOSIS — D649 Anemia, unspecified: Secondary | ICD-10-CM | POA: Diagnosis not present

## 2021-10-24 DIAGNOSIS — E785 Hyperlipidemia, unspecified: Secondary | ICD-10-CM | POA: Diagnosis not present

## 2021-10-24 DIAGNOSIS — Z0000 Encounter for general adult medical examination without abnormal findings: Secondary | ICD-10-CM | POA: Diagnosis not present

## 2021-11-16 DIAGNOSIS — C44329 Squamous cell carcinoma of skin of other parts of face: Secondary | ICD-10-CM | POA: Diagnosis not present

## 2021-12-05 DIAGNOSIS — E559 Vitamin D deficiency, unspecified: Secondary | ICD-10-CM | POA: Diagnosis not present

## 2021-12-05 DIAGNOSIS — R000 Tachycardia, unspecified: Secondary | ICD-10-CM | POA: Diagnosis not present

## 2021-12-05 DIAGNOSIS — D649 Anemia, unspecified: Secondary | ICD-10-CM | POA: Diagnosis not present

## 2021-12-05 DIAGNOSIS — K760 Fatty (change of) liver, not elsewhere classified: Secondary | ICD-10-CM | POA: Diagnosis not present

## 2021-12-05 DIAGNOSIS — E785 Hyperlipidemia, unspecified: Secondary | ICD-10-CM | POA: Diagnosis not present

## 2021-12-05 DIAGNOSIS — N1831 Chronic kidney disease, stage 3a: Secondary | ICD-10-CM | POA: Diagnosis not present

## 2021-12-05 DIAGNOSIS — E1165 Type 2 diabetes mellitus with hyperglycemia: Secondary | ICD-10-CM | POA: Diagnosis not present

## 2021-12-05 DIAGNOSIS — Z6833 Body mass index (BMI) 33.0-33.9, adult: Secondary | ICD-10-CM | POA: Diagnosis not present

## 2021-12-05 DIAGNOSIS — E6609 Other obesity due to excess calories: Secondary | ICD-10-CM | POA: Diagnosis not present

## 2021-12-05 DIAGNOSIS — E538 Deficiency of other specified B group vitamins: Secondary | ICD-10-CM | POA: Diagnosis not present

## 2021-12-05 DIAGNOSIS — K219 Gastro-esophageal reflux disease without esophagitis: Secondary | ICD-10-CM | POA: Diagnosis not present

## 2021-12-07 DIAGNOSIS — K219 Gastro-esophageal reflux disease without esophagitis: Secondary | ICD-10-CM | POA: Diagnosis not present

## 2021-12-07 DIAGNOSIS — J45909 Unspecified asthma, uncomplicated: Secondary | ICD-10-CM | POA: Diagnosis not present

## 2021-12-07 DIAGNOSIS — I10 Essential (primary) hypertension: Secondary | ICD-10-CM | POA: Diagnosis not present

## 2021-12-07 DIAGNOSIS — E785 Hyperlipidemia, unspecified: Secondary | ICD-10-CM | POA: Diagnosis not present

## 2021-12-07 DIAGNOSIS — Z6831 Body mass index (BMI) 31.0-31.9, adult: Secondary | ICD-10-CM | POA: Diagnosis not present

## 2021-12-07 DIAGNOSIS — E1169 Type 2 diabetes mellitus with other specified complication: Secondary | ICD-10-CM | POA: Diagnosis not present

## 2021-12-07 DIAGNOSIS — D649 Anemia, unspecified: Secondary | ICD-10-CM | POA: Diagnosis not present

## 2021-12-15 ENCOUNTER — Telehealth: Payer: Self-pay

## 2021-12-15 NOTE — Telephone Encounter (Signed)
NOTES SCANNED TO REFERRAL 

## 2021-12-16 ENCOUNTER — Ambulatory Visit (INDEPENDENT_AMBULATORY_CARE_PROVIDER_SITE_OTHER): Payer: Medicare Other

## 2021-12-16 ENCOUNTER — Ambulatory Visit (INDEPENDENT_AMBULATORY_CARE_PROVIDER_SITE_OTHER): Payer: Medicare Other | Admitting: Cardiovascular Disease

## 2021-12-16 ENCOUNTER — Other Ambulatory Visit: Payer: Self-pay

## 2021-12-16 ENCOUNTER — Encounter: Payer: Self-pay | Admitting: Cardiovascular Disease

## 2021-12-16 VITALS — BP 130/72 | HR 103 | Ht 63.0 in | Wt 179.6 lb

## 2021-12-16 DIAGNOSIS — R0789 Other chest pain: Secondary | ICD-10-CM

## 2021-12-16 DIAGNOSIS — I10 Essential (primary) hypertension: Secondary | ICD-10-CM | POA: Diagnosis not present

## 2021-12-16 DIAGNOSIS — R000 Tachycardia, unspecified: Secondary | ICD-10-CM | POA: Diagnosis not present

## 2021-12-16 MED ORDER — METOPROLOL TARTRATE 25 MG PO TABS: 25.0000 mg | ORAL_TABLET | Freq: Two times a day (BID) | ORAL | 3 refills | Status: DC

## 2021-12-16 NOTE — Progress Notes (Unsigned)
Applied a 3 day Zio XT monitor to patient in the office 

## 2021-12-16 NOTE — Progress Notes (Signed)
CARDIOLOGY CONSULT NOTE  ? ? ? ? ? ?Patient ID: ?Tiffany Weeks ?MRN: [id number] ?DOB/AGE: [birth year] 72 y.o. ? ?Admit date: (Not on file) ?Referring Physician: wong ?Primary Physician: Vernie Shanks, MD ?Primary Cardiologist: New ?Reason for Consultation: Tachycardia ? ?Active Problems: ?  * No active hospital problems. * ? ? ?HPI:  71 y.o. referred by Dr Jacelyn Grip for tachycardia. History of DM, HTN, palpitations History of atypical chest pain with normla myovue in 2013 and 2017 Event monitor with no arrhythmias Seen by Dr Jacelyn Grip 12/05/21 complained of elevated HR since November Atypical chest pain sharp for 2 weeks associated with belching ? GERD Had kidney stone in winter and HR elevated Pulse 102 in office A1c is 6.3 improved BUN/CR ok Hct 33.3 slightly worse LDL 57 ?Thyroid studies normal T4 8.9 TSH 4.6 ? ?Seen in ED 08/16/21 with abdominal / flank pain WBC elevated blood in urine CT with 2 mm proximal left ureteral stone mild obstructive uropathy Was to f/u with [facility]  ? ?ECG today SR rate 103 low voltage  ? ?ROS ?All other systems reviewed and negative except as noted above ? ?Past Medical History:  ?Diagnosis Date  ? Arthritis   ? Asthma   ? Back pain   ? DM (diabetes mellitus) ([place])   ? Glaucoma   ? Hyperlipidemia   ? Palpitation   ? Vitamin D deficiency   ?  ?Family History  ?Problem Relation Age of Onset  ? Diabetes Mother   ? Hypertension Mother   ? Heart failure Mother   ? Heart disease Mother   ? Liver disease Father   ? Liver disease Other   ? Aortic stenosis Other   ? Hyperlipidemia Other   ? Thyroid disease Other   ? Breast cancer Other   ? Breast cancer Sister 76  ?  ?Social History  ? ?Socioeconomic History  ? Marital status: Single  ?  Spouse name: Not on file  ? Number of children: Not on file  ? Years of education: Not on file  ? Highest education level: Not on file  ?Occupational History  ? Not on file  ?Tobacco Use  ? Smoking status: Never  ? Smokeless tobacco: Never  ?Substance and Sexual  Activity  ? Alcohol use: No  ? Drug use: No  ? Sexual activity: Not on file  ?Other Topics Concern  ? Not on file  ?Social History Narrative  ? Not on file  ? ?Social Determinants of Health  ? ?Financial Resource Strain: Not on file  ?Food Insecurity: Not on file  ?Transportation Needs: Not on file  ?Physical Activity: Not on file  ?Stress: Not on file  ?Social Connections: Not on file  ?Intimate Partner Violence: Not on file  ?  ?Past Surgical History:  ?Procedure Laterality Date  ? cataract surgery each eye    ? CHOLECYSTECTOMY    ?  ? ? ?Current Outpatient Medications:  ?  Accu-Chek Softclix Lancets lancets, See admin instructions., Disp: , Rfl:  ?  aspirin 81 MG tablet, Take 81 mg by mouth daily., Disp: , Rfl:  ?  cholecalciferol (VITAMIN D) 1000 UNITS tablet, Take 1,000 Units by mouth daily., Disp: , Rfl:  ?  CONTOUR NEXT TEST test strip, SMARTSIG:Via Meter, Disp: , Rfl:  ?  COVID-19 mRNA Vac-TriS, Pfizer, SUSP injection, Inject into the muscle., Disp: 0.3 mL, Rfl: 0 ?  Cyanocobalamin (VITAMIN B12) 1000 MCG TBCR, 1 tablet, Disp: , Rfl:  ?  ezetimibe-simvastatin (VYTORIN) 10-40  MG per tablet, Take 1 tablet by mouth as directed. , Disp: , Rfl:  ?  glimepiride (AMARYL) 1 MG tablet, Take 1 tablet by mouth daily, Disp: , Rfl: 0 ?  influenza vaccine adjuvanted (FLUAD) 0.5 ML injection, Inject into the muscle., Disp: 0.5 mL, Rfl: 0 ?  Iron, Ferrous Sulfate, 325 (65 Fe) MG TABS, 1 tablet, Disp: , Rfl:  ?  metFORMIN (GLUCOPHAGE) 500 MG tablet, Take 750 mg by mouth 2 (two) times daily with a meal. , Disp: , Rfl:  ?  metoprolol tartrate (LOPRESSOR) 25 MG tablet, Take 1 tablet (25 mg total) by mouth 2 (two) times daily., Disp: 180 tablet, Rfl: 3 ?  olmesartan (BENICAR) 40 MG tablet, Take 1 tablet (40 mg total) by mouth daily. Please keep upcoming appt for future refills. Thank you, Disp: 90 tablet, Rfl: 3 ?  pantoprazole (PROTONIX) 40 MG tablet, 1 tablet, Disp: , Rfl:  ?  Polyethyl Glycol-Propyl Glycol (SYSTANE OP), Place  1-2 drops into both eyes as directed. , Disp: , Rfl:  ?  RYBELSUS 7 MG TABS, Take 1 tablet by mouth daily., Disp: , Rfl:  ? ? ? ?Physical Exam: ?Blood pressure 130/72, pulse (!) 103, height 5\' 3"  (1.6 m), weight 179 lb 9.6 oz (81.5 kg), SpO2 98 %.   ? ?Affect appropriate ?Healthy:  appears stated age ?HEENT: normal ?Neck supple with no adenopathy ?JVP normal no bruits no thyromegaly ?Lungs clear with no wheezing and good diaphragmatic motion ?Heart:  S1/S2 no murmur, no rub, gallop or click ?PMI normal ?Abdomen: benighn, BS positve, no tenderness, no AAA ?no bruit.  No HSM or HJR ?Distal pulses intact with no bruits ?No edema ?Neuro non-focal ?Skin warm and dry ?No muscular weakness ? ? ?Labs: ?  ?Lab Results  ?Component Value Date  ? WBC 11.3 (H) 08/16/2021  ? HGB 11.5 (L) 08/16/2021  ? HCT 34.7 (L) 08/16/2021  ? MCV 92.5 08/16/2021  ? PLT 242 08/16/2021  ? No results for input(s): NA, K, CL, CO2, BUN, CREATININE, CALCIUM, PROT, BILITOT, ALKPHOS, ALT, AST, GLUCOSE in the last 168 hours. ? ?Invalid input(s): LABALBU ?No results found for: CKTOTAL, CKMB, CKMBINDEX, TROPONINI No results found for: CHOL ?No results found for: HDL ?No results found for: [place] ?No results found for: TRIG ?No results found for: CHOLHDL ?No results found for: LDLDIRECT  ?  ?Radiology: ?No results found. ? ?EKG: SR low voltage see HPI ? ? ?ASSESSMENT AND PLAN:  ? ?Tachycardia:  overweight with anemia. Thyroid ok check echo for EF and 48 hour monitor for average HR and nocturnal drop D/c Norvasc Start lopressor 25 mg bid ?HLDL  continue vytorin LDL at goal ?DM:  Discussed low carb diet.  Target hemoglobin A1c is 6.5 or less.  Continue current medications. ?HTN:  See above regarding chagnes  ? ?Echo  ?48 hour monitor  ? ?F/U 4-6 weeks ? ?Signed: ?Jenkins Rouge ?12/16/2021, 10:03 AM ? ? ?

## 2021-12-16 NOTE — Patient Instructions (Signed)
Medication Instructions:  ?Your physician has recommended you make the following change in your medication:  ?1-Stop Amlodipine  ?2-START Metoprolol 25 mg by mouth twice daily. ? ?*If you need a refill on your cardiac medications before your next appointment, please call your pharmacy* ? ?Lab Work: ?If you have labs (blood work) drawn today and your tests are completely normal, you will receive your results only by: ?MyChart Message (if you have MyChart) OR ?A paper copy in the mail ?If you have any lab test that is abnormal or we need to change your treatment, we will call you to review the results. ? ?Testing/Procedures: ?Your physician has requested that you have an echocardiogram. Echocardiography is a painless test that uses sound waves to create images of your heart. It provides your doctor with information about the size and shape of your heart and how well your heart?s chambers and valves are working. This procedure takes approximately one hour. There are no restrictions for this procedure. ? ?Your physician has recommended that you wear an 3 day monitor. Monitors are medical devices that record the heart?s electrical activity. Doctors most often [place] these monitors to diagnose arrhythmias. Arrhythmias are problems with the speed or rhythm of the heartbeat. The monitor is a small, portable device. You can wear one while you do your normal daily activities. This is usually used to diagnose what is causing palpitations/syncope (passing out). ? ?Follow-Up: ?At [facility], you and your health needs are our priority.  As part of our continuing mission to provide you with exceptional heart care, we have created designated Provider Care Teams.  These Care Teams include your primary Cardiologist (physician) and Advanced Practice Providers (APPs -  Physician Assistants and Nurse Practitioners) who all work together to provide you with the care you need, when you need it. ? ?We recommend signing up for the patient  portal called "MyChart".  Sign up information is provided on this After Visit Summary.  MyChart is used to connect with patients for Virtual Visits (Telemedicine).  Patients are able to view lab/test results, encounter notes, upcoming appointments, etc.  Non-urgent messages can be sent to your provider as well.   ?To learn more about what you can do with MyChart, go to [email or web address].   ? ?Your next appointment:   ?4 week(s) ? ?The format for your next appointment:   ?In Person ? ?Provider:   ?Jenkins Rouge, MD  or Nicholes Rough, PA-C, Melina Copa, PA-C, Ermalinda Barrios, PA-C, Christen Bame, NP, or Richardson Dopp, PA-C      ? ? ? ?

## 2021-12-19 ENCOUNTER — Telehealth: Payer: Self-pay | Admitting: Cardiovascular Disease

## 2021-12-19 NOTE — Telephone Encounter (Signed)
Patient called and said she is having a heart time getting  her heart monitor off. She would like someone to give her some advice. Please call her on her cell phone  ?

## 2021-12-19 NOTE — Telephone Encounter (Signed)
Attempted to return call to patient, but no answer on either number. Mobile phone does not have voicemail capability (voicemail box full). Per instructions on zio site, there is an adhesive remover provided for removal. This swab is located in back of book, approx last 2 pages. Pt should start by lifting up the zio plastic center and place adhesive remover wipe underneath and swab while lifting from center out, then repeat on other side of patch. ? ?Will re-attempt to call patient. ? ?

## 2021-12-19 NOTE — Telephone Encounter (Signed)
Attempted call again with no answer and full voicemail ?

## 2021-12-20 NOTE — Telephone Encounter (Signed)
No answer, voice mailbox full. ?

## 2021-12-23 ENCOUNTER — Ambulatory Visit (HOSPITAL_COMMUNITY): Payer: Medicare Other | Attending: Cardiology

## 2021-12-23 ENCOUNTER — Other Ambulatory Visit: Payer: Self-pay

## 2021-12-23 DIAGNOSIS — I10 Essential (primary) hypertension: Secondary | ICD-10-CM | POA: Diagnosis not present

## 2021-12-23 DIAGNOSIS — R000 Tachycardia, unspecified: Secondary | ICD-10-CM | POA: Diagnosis not present

## 2021-12-23 DIAGNOSIS — R0789 Other chest pain: Secondary | ICD-10-CM | POA: Diagnosis not present

## 2021-12-23 LAB — ECHOCARDIOGRAM COMPLETE
Area-P 1/2: 5.97 cm2
S' Lateral: 2.7 cm

## 2021-12-23 NOTE — Telephone Encounter (Signed)
As of 12/21/21 [employer] has received Tiffany Weeks's ZIO XT monitor and has started to process so it appears Tiffany Weeks was able to remove her patch monitor. ?

## 2021-12-26 DIAGNOSIS — I10 Essential (primary) hypertension: Secondary | ICD-10-CM | POA: Diagnosis not present

## 2021-12-26 DIAGNOSIS — R000 Tachycardia, unspecified: Secondary | ICD-10-CM | POA: Diagnosis not present

## 2021-12-26 DIAGNOSIS — R0789 Other chest pain: Secondary | ICD-10-CM | POA: Diagnosis not present

## 2021-12-30 ENCOUNTER — Telehealth: Payer: Self-pay

## 2021-12-30 DIAGNOSIS — I471 Supraventricular tachycardia: Secondary | ICD-10-CM

## 2021-12-30 NOTE — Telephone Encounter (Signed)
Left message for patient to call back. Will place order for referral, so she can schedule when she calls back. ?

## 2021-12-30 NOTE — Telephone Encounter (Signed)
-----   Message from Josue Hector, MD sent at 12/26/2021  1:38 PM EDT ----- ?Refer to EP for multiple episodes of SVT ?

## 2022-01-02 DIAGNOSIS — Z9189 Other specified personal risk factors, not elsewhere classified: Secondary | ICD-10-CM | POA: Diagnosis not present

## 2022-01-02 DIAGNOSIS — Z803 Family history of malignant neoplasm of breast: Secondary | ICD-10-CM | POA: Diagnosis not present

## 2022-01-09 NOTE — Progress Notes (Deleted)
? ?Cardiology Office Note   ? ?Date:  01/09/2022  ? ?ID:  Tiffany Weeks, DOB 12/06/1950, MRN [id number] ? ? ?PCP:  Vernie Shanks, MD ?  ?[place]  ?Cardiologist:  Jenkins Rouge, MD *** ?Advanced Practice Provider:  No care team member to display ?Electrophysiologist:  None  ? ?[id number]}  ? ?No chief complaint on file. ? ? ?History of Present Illness:  ?Tiffany Weeks is a 71 y.o. female *** ? ? ? ?Past Medical History:  ?Diagnosis Date  ? Arthritis   ? Asthma   ? Back pain   ? DM (diabetes mellitus) ([place])   ? Glaucoma   ? Hyperlipidemia   ? Palpitation   ? Vitamin D deficiency   ? ? ?Past Surgical History:  ?Procedure Laterality Date  ? cataract surgery each eye    ? CHOLECYSTECTOMY    ? ? ?Current Medications: ?No outpatient medications have been marked as taking for the 01/11/22 encounter (Appointment) with Imogene Burn, PA-C.  ?  ? ?Allergies:   Align [acidophilus], Bifidobacterium, Losartan, Naprosyn [naproxen], Amoxicillin, Ciprofloxacin hcl, and Tape  ? ?Social History  ? ?Socioeconomic History  ? Marital status: Single  ?  Spouse name: Not on file  ? Number of children: Not on file  ? Years of education: Not on file  ? Highest education level: Not on file  ?Occupational History  ? Not on file  ?Tobacco Use  ? Smoking status: Never  ? Smokeless tobacco: Never  ?Substance and Sexual Activity  ? Alcohol use: No  ? Drug use: No  ? Sexual activity: Not on file  ?Other Topics Concern  ? Not on file  ?Social History Narrative  ? Not on file  ? ?Social Determinants of Health  ? ?Financial Resource Strain: Not on file  ?Food Insecurity: Not on file  ?Transportation Needs: Not on file  ?Physical Activity: Not on file  ?Stress: Not on file  ?Social Connections: Not on file  ?  ? ?Family History:  The patient's ***family history includes Aortic stenosis in an other family member; Breast cancer in an other family member; Breast cancer (age of onset: 15) in her sister; Diabetes in her mother;  Heart disease in her mother; Heart failure in her mother; Hyperlipidemia in an other family member; Hypertension in her mother; Liver disease in her father and another family member; Thyroid disease in an other family member.  ? ?ROS:   ?Please see the history of present illness.    ?ROS All other systems reviewed and are negative. ? ? ?PHYSICAL EXAM:   ?VS:  There were no vitals taken for this visit.  ?Physical Exam  ?GEN: Well nourished, well developed, in no acute distress  ?HEENT: normal  ?Neck: no JVD, carotid bruits, or masses ?Cardiac:RRR; no murmurs, rubs, or gallops  ?Respiratory:  clear to auscultation bilaterally, normal work of breathing ?GI: soft, nontender, nondistended, + BS ?Ext: without cyanosis, clubbing, or edema, Good distal pulses bilaterally ?MS: no deformity or atrophy  ?Skin: warm and dry, no rash ?Neuro:  Alert and Oriented x 3, Strength and sensation are intact ?Psych: euthymic mood, full affect ? ?Wt Readings from Last 3 Encounters:  ?12/16/21 179 lb 9.6 oz (81.5 kg)  ?08/16/21 186 lb (84.4 kg)  ?03/22/20 192 lb (87.1 kg)  ?  ? ? ?Studies/Labs Reviewed:  ? ?EKG:  EKG is*** ordered today.  The ekg ordered today demonstrates *** ? ?Recent Labs: ?08/16/2021: BUN 21; Creatinine, Ser  1.11; Hemoglobin 11.5; Platelets 242; Potassium 4.1; Sodium 135  ? ?Lipid Panel ?No results found for: CHOL, TRIG, HDL, CHOLHDL, VLDL, LDLCALC, LDLDIRECT ? ?Additional studies/ records that were reviewed today include:  ?*** ? ? ?Risk Assessment/Calculations:   ?{Does this patient have ATRIAL FIBRILLATION?:[id number]} ? ? ? ? ?ASSESSMENT:   ? ?No diagnosis found. ? ? ?PLAN:  ?In order of problems listed above: ? ? ? ?Shared Decision Making/Informed Consent   ?{Are you ordering a CV Procedure (e.g. stress test, cath, DCCV, TEE, etc)?   Press F2        :[id number]}  ? ? ?Medication Adjustments/Labs and Tests Ordered: ?Current medicines are reviewed at length with the patient today.  Concerns regarding medicines are  outlined above.  Medication changes, Labs and Tests ordered today are listed in the Patient Instructions below. ?There are no Patient Instructions on file for this visit.  ? ?Signed, ?Ermalinda Barrios, PA-C  ?01/09/2022 3:23 PM    ?[facility] ?[address], [place], [place]  [address] ?Phone: [phone]; Fax: [phone]  ? ? ?

## 2022-01-11 ENCOUNTER — Ambulatory Visit: Payer: Federal, State, Local not specified - PPO | Admitting: Physician Assistant

## 2022-01-19 ENCOUNTER — Ambulatory Visit (INDEPENDENT_AMBULATORY_CARE_PROVIDER_SITE_OTHER): Payer: Medicare Other | Admitting: Cardiology

## 2022-01-19 ENCOUNTER — Encounter: Payer: Self-pay | Admitting: Cardiology

## 2022-01-19 VITALS — BP 152/90 | HR 100 | Ht 63.0 in | Wt 179.2 lb

## 2022-01-19 DIAGNOSIS — I471 Supraventricular tachycardia: Secondary | ICD-10-CM

## 2022-01-19 MED ORDER — METOPROLOL TARTRATE 50 MG PO TABS: 50.0000 mg | ORAL_TABLET | Freq: Two times a day (BID) | ORAL | 3 refills | Status: DC

## 2022-01-19 NOTE — Progress Notes (Signed)
? ?Electrophysiology Office Note ? ? ?Date:  01/19/2022  ? ?ID:  Tiffany Weeks, DOB 12-03-1950, MRN [id number] ? ?PCP:  Vernie Shanks, MD  ?Cardiologist:  Johnsie Cancel ?Primary Electrophysiologist:  Kyli Sorter Meredith Leeds, MD   ? ?Chief Complaint: SVT ?  ?History of Present Illness: ?Tiffany Weeks is a 71 y.o. female who is being seen today for the evaluation of SVT at the request of Josue Hector, MD. Presenting today for electrophysiology evaluation. ? ?She has a history significant for diabetes, hypertension.  She presented to cardiology from her primary physician's office due to tachycardia episodes.  She had been having elevated heart rates.  She wore a cardiac monitor that showed episodes of SVT. ? ?Today, she denies symptoms of palpitations, chest pain, shortness of breath, orthopnea, PND, lower extremity edema, claudication, dizziness, presyncope, syncope, bleeding, or neurologic sequela. The patient is tolerating medications without difficulties.  Today she feels well.  She does continue to have episodes of palpitations, though they are not overtly bothersome.  She states that she continues to do all of her daily activities.  Her palpitations occur a few times a week.  There are no exacerbating or alleviating factors.  She also has some stabbing chest discomfort that lasts less than a second.  She is not overly bothered by her palpitations. ? ? ?Past Medical History:  ?Diagnosis Date  ? Arthritis   ? Asthma   ? Back pain   ? DM (diabetes mellitus) ([place])   ? Glaucoma   ? Hyperlipidemia   ? Palpitation   ? Vitamin D deficiency   ? ?Past Surgical History:  ?Procedure Laterality Date  ? cataract surgery each eye    ? CHOLECYSTECTOMY    ? ? ? ?Current Outpatient Medications  ?Medication Sig Dispense Refill  ? Accu-Chek Softclix Lancets lancets See admin instructions.    ? aspirin 81 MG tablet Take 81 mg by mouth daily.    ? cholecalciferol (VITAMIN D) 1000 UNITS tablet Take 1,000 Units by mouth daily.    ? CONTOUR  NEXT TEST test strip SMARTSIG:Via Meter    ? COVID-19 mRNA Vac-TriS, Pfizer, SUSP injection Inject into the muscle. 0.3 mL 0  ? Cyanocobalamin (VITAMIN B12) 1000 MCG TBCR 1 tablet    ? ezetimibe-simvastatin (VYTORIN) 10-40 MG per tablet Take 1 tablet by mouth as directed.     ? glimepiride (AMARYL) 1 MG tablet Take 1 tablet by mouth daily  0  ? influenza vaccine adjuvanted (FLUAD) 0.5 ML injection Inject into the muscle. 0.5 mL 0  ? Iron, Ferrous Sulfate, 325 (65 Fe) MG TABS 1 tablet    ? metFORMIN (GLUCOPHAGE) 500 MG tablet Take 750 mg by mouth 2 (two) times daily with a meal.     ? metoprolol tartrate (LOPRESSOR) 25 MG tablet Take 1 tablet (25 mg total) by mouth 2 (two) times daily. 180 tablet 3  ? olmesartan (BENICAR) 40 MG tablet Take 1 tablet (40 mg total) by mouth daily. Please keep upcoming appt for future refills. Thank you 90 tablet 3  ? pantoprazole (PROTONIX) 40 MG tablet 1 tablet    ? Polyethyl Glycol-Propyl Glycol (SYSTANE OP) Place 1-2 drops into both eyes as directed.     ? RYBELSUS 7 MG TABS Take 1 tablet by mouth daily.    ? ?No current facility-administered medications for this visit.  ? ? ?Allergies:   Align prebiotic-probiotic, Align [acidophilus], Amoxicillin-pot clavulanate, Bifidobacterium, Ciprofloxacin, Losartan, Naprosyn [naproxen], Pancrelipase (lip-prot-amyl), Amoxicillin, Ciprofloxacin hcl, and Tape  ? ?  Social History:  The patient  reports that she has never smoked. She has never used smokeless tobacco. She reports that she does not drink alcohol and does not use drugs.  ? ?Family History:  The patient's family history includes Aortic stenosis in an other family member; Breast cancer in an other family member; Breast cancer (age of onset: 30) in her sister; Diabetes in her mother; Heart disease in her mother; Heart failure in her mother; Hyperlipidemia in an other family member; Hypertension in her mother; Liver disease in her father and another family member; Thyroid disease in an  other family member.  ? ? ?ROS:  Please see the history of present illness.   Otherwise, review of systems is positive for none.   All other systems are reviewed and negative.  ? ? ?PHYSICAL EXAM: ?VS:  BP (!) 152/90   Pulse 100   Ht '5\' 3"'$  (1.6 m)   Wt 179 lb 3.2 oz (81.3 kg)   SpO2 97%   BMI 31.74 kg/m?  , BMI Body mass index is 31.74 kg/m?. ?GEN: Well nourished, well developed, in no acute distress  ?HEENT: normal  ?Neck: no JVD, carotid bruits, or masses ?Cardiac: RRR; no murmurs, rubs, or gallops,no edema  ?Respiratory:  clear to auscultation bilaterally, normal work of breathing ?GI: soft, nontender, nondistended, + BS ?MS: no deformity or atrophy  ?Skin: warm and dry ?Neuro:  Strength and sensation are intact ?Psych: euthymic mood, full affect ? ?EKG:  EKG is ordered today. ?Personal review of the ekg ordered shows sinus rhythm, rate 100 ? ?Recent Labs: ?08/16/2021: BUN 21; Creatinine, Ser 1.11; Hemoglobin 11.5; Platelets 242; Potassium 4.1; Sodium 135  ? ? ?Lipid Panel  ?No results found for: CHOL, TRIG, HDL, CHOLHDL, VLDL, LDLCALC, LDLDIRECT ? ? ?Wt Readings from Last 3 Encounters:  ?01/19/22 179 lb 3.2 oz (81.3 kg)  ?12/16/21 179 lb 9.6 oz (81.5 kg)  ?08/16/21 186 lb (84.4 kg)  ?  ? ? ?Other studies Reviewed: ?Additional studies/ records that were reviewed today include: TTE 12/23/21  ?Review of the above records today demonstrates:  ? 1. The left ventricle has hyperdynamic function. The left ventricle has  ?no regional wall motion abnormalities. There is mild concentric left  ?ventricular hypertrophy. Left ventricular diastolic parameters are  ?consistent with Grade I diastolic dysfunction  ? (impaired relaxation).  ? 2. Right ventricular systolic function is normal. The right ventricular  ?size is normal. There is normal pulmonary artery systolic pressure.  ? 3. The mitral valve is normal in structure. No evidence of mitral valve  ?regurgitation. No evidence of mitral stenosis.  ? 4. The aortic valve  is normal in structure. Aortic valve regurgitation is  ?not visualized. No aortic stenosis is present.  ? 5. The inferior vena cava is normal in size with greater than 50%  ?respiratory variability, suggesting right atrial pressure of 3 mmHg.  ? ?Cardiac monitor 12/26/2021 personally reviewed ?Patient had a min HR of 67 bpm, max HR of 154 bpm, and avg HR of 97 bpm. Predominant underlying rhythm was Sinus Rhythm. 107 Supraventricular Tachycardia runs occurred, the run with the fastest interval lasting 4 beats with a max rate of 130 bpm, the  ?longest lasting 37.7 secs with an avg rate of 108 bpm. Supraventricular Tachycardia was detected within +/- 45 seconds of symptomatic patient event(s). Isolated SVEs were rare (<1.0%), SVE Couplets were rare (<1.0%), and SVE Triplets were rare (<1.0%).  ?Isolated VEs were rare (<1.0%), and no VE Couplets or  VE Triplets were present.  ? ?ASSESSMENT AND PLAN: ? ?1.  SVT: Occurs at random times without any exacerbating or alleviating factors.  In review of her cardiac monitor, she has a one-to-one tachycardia that could be sinus tachycardia.  She is somewhat symptomatic from this, but can do all of her daily activities and the prefer medical management.  We Vernice Mannina increase her metoprolol to 50 mg twice daily. ? ?2.  Hypertension: Blood pressure has been elevated since stopping her amlodipine.  Planning to increase her metoprolol.  We Izsak Meir have her follow-up with general cardiology for further adjustments.. ? ?Current medicines are reviewed at length with the patient today.   ?The patient does not have concerns regarding her medicines.  The following changes were made today: Increase metoprolol ? ?Labs/ tests ordered today include:  ?Orders Placed This Encounter  ?Procedures  ? EKG 12-Lead  ? ? ? ?Disposition:   FU with Karrina Lye if SVT symptoms recur ? ?Signed, ?Glorya Bartley Meredith Leeds, MD  ?01/19/2022 12:16 PM    ? ?[facility] ?[address] ?[address] ?[place]  [address] ?[phone] (office) ?[phone] (fax) ? ?

## 2022-01-19 NOTE — Patient Instructions (Signed)
Medication Instructions:  ?INCREASE your Metoprolol to 50 mg twice a day ? ?*If you need a refill on your cardiac medications before your next appointment, please call your pharmacy* ? ? ?Lab Work: ?None ordered ? ? ? ?Testing/Procedures: ?None ordered ? ? ?Follow-Up: ?At [facility], you and your health needs are our priority.  As part of our continuing mission to provide you with exceptional heart care, we have created designated Provider Care Teams.  These Care Teams include your primary Cardiologist (physician) and Advanced Practice Providers (APPs -  Physician Assistants and Nurse Practitioners) who all work together to provide you with the care you need, when you need it. ? ? ? ?Your next appointment:   ?3 month(s) ? ?The format for your next appointment:   ?In Person ? ?Provider:   ?Jenkins Rouge, MD  ? ? ? ?Thank you for choosing [facility]!! ? ? ?Trinidad Curet, RN ?([phone] ? ? ?  ?

## 2022-02-10 DIAGNOSIS — S30861A Insect bite (nonvenomous) of abdominal wall, initial encounter: Secondary | ICD-10-CM | POA: Diagnosis not present

## 2022-02-10 DIAGNOSIS — W57XXXA Bitten or stung by nonvenomous insect and other nonvenomous arthropods, initial encounter: Secondary | ICD-10-CM | POA: Diagnosis not present

## 2022-03-01 DIAGNOSIS — R8271 Bacteriuria: Secondary | ICD-10-CM | POA: Diagnosis not present

## 2022-03-01 DIAGNOSIS — N202 Calculus of kidney with calculus of ureter: Secondary | ICD-10-CM | POA: Diagnosis not present

## 2022-03-05 DIAGNOSIS — S70362A Insect bite (nonvenomous), left thigh, initial encounter: Secondary | ICD-10-CM | POA: Diagnosis not present

## 2022-03-05 DIAGNOSIS — W57XXXA Bitten or stung by nonvenomous insect and other nonvenomous arthropods, initial encounter: Secondary | ICD-10-CM | POA: Diagnosis not present

## 2022-03-09 DIAGNOSIS — R140 Abdominal distension (gaseous): Secondary | ICD-10-CM | POA: Diagnosis not present

## 2022-03-29 ENCOUNTER — Other Ambulatory Visit: Payer: Self-pay | Admitting: Family Medicine

## 2022-03-29 DIAGNOSIS — Z1231 Encounter for screening mammogram for malignant neoplasm of breast: Secondary | ICD-10-CM

## 2022-03-29 DIAGNOSIS — S00462A Insect bite (nonvenomous) of left ear, initial encounter: Secondary | ICD-10-CM | POA: Diagnosis not present

## 2022-03-29 DIAGNOSIS — W57XXXA Bitten or stung by nonvenomous insect and other nonvenomous arthropods, initial encounter: Secondary | ICD-10-CM | POA: Diagnosis not present

## 2022-04-11 DIAGNOSIS — L578 Other skin changes due to chronic exposure to nonionizing radiation: Secondary | ICD-10-CM | POA: Diagnosis not present

## 2022-04-11 DIAGNOSIS — L570 Actinic keratosis: Secondary | ICD-10-CM | POA: Diagnosis not present

## 2022-04-11 DIAGNOSIS — Z859 Personal history of malignant neoplasm, unspecified: Secondary | ICD-10-CM | POA: Diagnosis not present

## 2022-04-11 DIAGNOSIS — L821 Other seborrheic keratosis: Secondary | ICD-10-CM | POA: Diagnosis not present

## 2022-04-11 DIAGNOSIS — Z85828 Personal history of other malignant neoplasm of skin: Secondary | ICD-10-CM | POA: Diagnosis not present

## 2022-04-20 NOTE — Progress Notes (Signed)
CARDIOLOGY CONSULT NOTE       Patient ID: Tiffany Weeks MRN: [id number] DOB/AGE: September 14, 1951 71 y.o.  Admit date: (Not on file) Referring Physician: wong Primary Physician: Vernie Shanks, MD Primary Cardiologist: New Reason for Consultation: Tachycardia  Active Problems:   * No active hospital problems. *   HPI:  71 y.o. referred by Dr Jacelyn Grip for tachycardia. History of DM, HTN, palpitations History of atypical chest pain with normla myovue in 2013 and 2017 Event monitor with no arrhythmias Seen by Dr Jacelyn Grip 12/05/21 complained of elevated HR since November Atypical chest pain sharp for 2 weeks associated with belching ? GERD Had kidney stone in winter and HR elevated Pulse 102 in office A1c is 6.3 improved BUN/CR ok Hct 33.3 slightly worse LDL 57 Thyroid studies normal T4 8.9 TSH 4.6  Seen in ED 08/16/21 with abdominal / flank pain WBC elevated blood in urine CT with 2 mm proximal left ureteral stone mild obstructive uropathy Was to f/u with [facility]   ECG today SR rate 103 low voltage   Monitor 12/26/21 with average HR 97 bpm Had 107 SVT rund longest 37.7 seconds  at HR 108 bpm with symptoms   Seen by Dr Curt Bears 01/19/22 and lopressor increased to 50 mg bid   Home readings good Has a cat "Smokey" to keep her company Does day trips with [employer] [facility]  ROS All other systems reviewed and negative except as noted above  Past Medical History:  Diagnosis Date   Arthritis    Asthma    Back pain    DM (diabetes mellitus) ([place])    Glaucoma    Hyperlipidemia    Palpitation    Vitamin D deficiency     Family History  Problem Relation Age of Onset   Diabetes Mother    Hypertension Mother    Heart failure Mother    Heart disease Mother    Liver disease Father    Liver disease Other    Aortic stenosis Other    Hyperlipidemia Other    Thyroid disease Other    Breast cancer Other    Breast cancer Sister 52    Social History   Socioeconomic History   Marital  status: Single    Spouse name: Not on file   Number of children: Not on file   Years of education: Not on file   Highest education level: Not on file  Occupational History   Not on file  Tobacco Use   Smoking status: Never   Smokeless tobacco: Never  Substance and Sexual Activity   Alcohol use: No   Drug use: No   Sexual activity: Not on file  Other Topics Concern   Not on file  Social History Narrative   Not on file   Social Determinants of Health   Financial Resource Strain: Not on file  Food Insecurity: Not on file  Transportation Needs: Not on file  Physical Activity: Not on file  Stress: Not on file  Social Connections: Not on file  Intimate Partner Violence: Not on file    Past Surgical History:  Procedure Laterality Date   cataract surgery each eye     CHOLECYSTECTOMY        Current Outpatient Medications:    Accu-Chek Softclix Lancets lancets, See admin instructions., Disp: , Rfl:    aspirin 81 MG tablet, Take 81 mg by mouth daily., Disp: , Rfl:    cholecalciferol (VITAMIN D) 1000 UNITS tablet, Take 1,000 Units  by mouth daily., Disp: , Rfl:    CONTOUR NEXT TEST test strip, SMARTSIG:Via Meter, Disp: , Rfl:    COVID-19 mRNA Vac-TriS, Pfizer, SUSP injection, Inject into the muscle., Disp: 0.3 mL, Rfl: 0   Cyanocobalamin (VITAMIN B12) 1000 MCG TBCR, 1 tablet, Disp: , Rfl:    ezetimibe-simvastatin (VYTORIN) 10-40 MG per tablet, Take 1 tablet by mouth as directed. , Disp: , Rfl:    glimepiride (AMARYL) 1 MG tablet, Take 1 tablet by mouth daily, Disp: , Rfl: 0   influenza vaccine adjuvanted (FLUAD) 0.5 ML injection, Inject into the muscle., Disp: 0.5 mL, Rfl: 0   Iron, Ferrous Sulfate, 325 (65 Fe) MG TABS, 1 tablet, Disp: , Rfl:    metFORMIN (GLUCOPHAGE) 500 MG tablet, Take 750 mg by mouth 2 (two) times daily with a meal. , Disp: , Rfl:    metoprolol tartrate (LOPRESSOR) 50 MG tablet, Take 1 tablet (50 mg total) by mouth 2 (two) times daily., Disp: 180 tablet, Rfl:  3   olmesartan (BENICAR) 40 MG tablet, Take 1 tablet (40 mg total) by mouth daily. Please keep upcoming appt for future refills. Thank you, Disp: 90 tablet, Rfl: 3   pantoprazole (PROTONIX) 40 MG tablet, 1 tablet, Disp: , Rfl:    Polyethyl Glycol-Propyl Glycol (SYSTANE OP), Place 1-2 drops into both eyes as directed. , Disp: , Rfl:    RYBELSUS 7 MG TABS, Take 1 tablet by mouth daily., Disp: , Rfl:     Physical Exam: There were no vitals taken for this visit.    Affect appropriate Healthy:  appears stated age 38: normal Neck supple with no adenopathy JVP normal no bruits no thyromegaly Lungs clear with no wheezing and good diaphragmatic motion Heart:  S1/S2 no murmur, no rub, gallop or click PMI normal Abdomen: benighn, BS positve, no tenderness, no AAA no bruit.  No HSM or HJR Distal pulses intact with no bruits No edema Neuro non-focal Skin warm and dry No muscular weakness   Labs:   Lab Results  Component Value Date   WBC 11.3 (H) 08/16/2021   HGB 11.5 (L) 08/16/2021   HCT 34.7 (L) 08/16/2021   MCV 92.5 08/16/2021   PLT 242 08/16/2021   No results for input(s): "NA", "K", "CL", "CO2", "BUN", "CREATININE", "CALCIUM", "PROT", "BILITOT", "ALKPHOS", "ALT", "AST", "GLUCOSE" in the last 168 hours.  Invalid input(s): "LABALBU" No results found for: "CKTOTAL", "CKMB", "CKMBINDEX", "TROPONINI" No results found for: "CHOL" No results found for: "HDL" No results found for: "LDLCALC" No results found for: "TRIG" No results found for: "CHOLHDL" No results found for: "LDLDIRECT"    Radiology: No results found.  EKG: SR low voltage see HPI   ASSESSMENT AND PLAN:   Tachycardia:  overweight with anemia. Thyroid ok Echo with no structural heart issues EF 65-70% Monitor with SVT see above Started lopressor 25 mg bid on 12/16/21 Dose increased by Dr Curt Bears to 50 mg bid on 01/19/22 improved  HLDL  continue vytorin LDL at goal DM:  Discussed low carb diet.  Target hemoglobin A1c  is 6.5 or less.  Continue current medications. HTN:  See above regarding chagnes   F/U in a year   Signed: Jenkins Rouge 04/20/2022, 12:04 PM

## 2022-04-24 DIAGNOSIS — L819 Disorder of pigmentation, unspecified: Secondary | ICD-10-CM | POA: Diagnosis not present

## 2022-05-03 ENCOUNTER — Ambulatory Visit (INDEPENDENT_AMBULATORY_CARE_PROVIDER_SITE_OTHER): Payer: Medicare Other | Admitting: Cardiovascular Disease

## 2022-05-03 ENCOUNTER — Encounter: Payer: Self-pay | Admitting: Cardiovascular Disease

## 2022-05-03 VITALS — BP 164/80 | HR 53 | Ht 63.0 in | Wt 178.6 lb

## 2022-05-03 DIAGNOSIS — I471 Supraventricular tachycardia: Secondary | ICD-10-CM | POA: Diagnosis not present

## 2022-05-03 DIAGNOSIS — I10 Essential (primary) hypertension: Secondary | ICD-10-CM

## 2022-05-03 DIAGNOSIS — R000 Tachycardia, unspecified: Secondary | ICD-10-CM

## 2022-05-03 NOTE — Patient Instructions (Signed)
Medication Instructions:  Your physician recommends that you continue on your current medications as directed. Please refer to the Current Medication list given to you today.  *If you need a refill on your cardiac medications before your next appointment, please call your pharmacy*  Lab Work: If you have labs (blood work) drawn today and your tests are completely normal, you will receive your results only by: MyChart Message (if you have MyChart) OR A paper copy in the mail If you have any lab test that is abnormal or we need to change your treatment, we will call you to review the results.  Testing/Procedures: None ordered today.  Follow-Up: At [facility], you and your health needs are our priority.  As part of our continuing mission to provide you with exceptional heart care, we have created designated Provider Care Teams.  These Care Teams include your primary Cardiologist (physician) and Advanced Practice Providers (APPs -  Physician Assistants and Nurse Practitioners) who all work together to provide you with the care you need, when you need it.  We recommend signing up for the patient portal called "MyChart".  Sign up information is provided on this After Visit Summary.  MyChart is used to connect with patients for Virtual Visits (Telemedicine).  Patients are able to view lab/test results, encounter notes, upcoming appointments, etc.  Non-urgent messages can be sent to your provider as well.   To learn more about what you can do with MyChart, go to [email or web address].    Your next appointment:   12 month(s)  The format for your next appointment:   In Person  Provider:   Peter Nishan, MD {   Important Information About Sugar       

## 2022-05-10 DIAGNOSIS — H40013 Open angle with borderline findings, low risk, bilateral: Secondary | ICD-10-CM | POA: Diagnosis not present

## 2022-05-10 DIAGNOSIS — H43813 Vitreous degeneration, bilateral: Secondary | ICD-10-CM | POA: Diagnosis not present

## 2022-05-10 DIAGNOSIS — H04123 Dry eye syndrome of bilateral lacrimal glands: Secondary | ICD-10-CM | POA: Diagnosis not present

## 2022-05-10 DIAGNOSIS — H18593 Other hereditary corneal dystrophies, bilateral: Secondary | ICD-10-CM | POA: Diagnosis not present

## 2022-05-10 DIAGNOSIS — E119 Type 2 diabetes mellitus without complications: Secondary | ICD-10-CM | POA: Diagnosis not present

## 2022-05-12 ENCOUNTER — Ambulatory Visit
Admission: RE | Admit: 2022-05-12 | Discharge: 2022-05-12 | Disposition: A | Discharge status: Home / Self Care | Payer: Federal, State, Local not specified - PPO | Source: Ambulatory Visit | Attending: Family Medicine | Admitting: Family Medicine

## 2022-05-12 DIAGNOSIS — Z1231 Encounter for screening mammogram for malignant neoplasm of breast: Secondary | ICD-10-CM

## 2022-06-07 DIAGNOSIS — I10 Essential (primary) hypertension: Secondary | ICD-10-CM | POA: Diagnosis not present

## 2022-06-07 DIAGNOSIS — N1831 Chronic kidney disease, stage 3a: Secondary | ICD-10-CM | POA: Diagnosis not present

## 2022-06-07 DIAGNOSIS — E1165 Type 2 diabetes mellitus with hyperglycemia: Secondary | ICD-10-CM | POA: Diagnosis not present

## 2022-06-07 DIAGNOSIS — E785 Hyperlipidemia, unspecified: Secondary | ICD-10-CM | POA: Diagnosis not present

## 2022-06-26 DIAGNOSIS — R140 Abdominal distension (gaseous): Secondary | ICD-10-CM | POA: Diagnosis not present

## 2022-06-26 DIAGNOSIS — Z86010 Personal history of colonic polyps: Secondary | ICD-10-CM | POA: Diagnosis not present

## 2022-07-04 DIAGNOSIS — I10 Essential (primary) hypertension: Secondary | ICD-10-CM | POA: Diagnosis not present

## 2022-07-04 DIAGNOSIS — Z6832 Body mass index (BMI) 32.0-32.9, adult: Secondary | ICD-10-CM | POA: Diagnosis not present

## 2022-07-04 DIAGNOSIS — E1165 Type 2 diabetes mellitus with hyperglycemia: Secondary | ICD-10-CM | POA: Diagnosis not present

## 2022-07-04 DIAGNOSIS — E785 Hyperlipidemia, unspecified: Secondary | ICD-10-CM | POA: Diagnosis not present

## 2022-07-04 DIAGNOSIS — D649 Anemia, unspecified: Secondary | ICD-10-CM | POA: Diagnosis not present

## 2022-07-06 ENCOUNTER — Telehealth: Payer: Self-pay | Admitting: Cardiovascular Disease

## 2022-07-06 NOTE — Telephone Encounter (Signed)
Pt c/o medication issue:  1. Name of Medication:  Amiodarone 5 MG  2. How are you currently taking this medication (dosage and times per day)?   3. Are you having a reaction (difficulty breathing--STAT)?   4. What is your medication issue?   FYI--Patient is calling to inform Dr. Johnsie Cancel that her PCP put her on Amlodipine 5 MG one tablet daily for BP for 30 days.

## 2022-07-07 NOTE — Telephone Encounter (Signed)
Called patient back about her message. Patient stated her PCP put her on amlodipine 5 mg by mouth daily for elevated BP. Patient stated she has been taking it for 3 days and her SBP is down to 130's. Patient stated she has no issues with the new medication. Informed patient that her PCP should treat her BP if it is high. Informed patient that we would make Dr. Johnsie Cancel aware of medication changes and update her medication list.

## 2022-08-02 DIAGNOSIS — E1165 Type 2 diabetes mellitus with hyperglycemia: Secondary | ICD-10-CM | POA: Diagnosis not present

## 2022-08-02 DIAGNOSIS — K219 Gastro-esophageal reflux disease without esophagitis: Secondary | ICD-10-CM | POA: Diagnosis not present

## 2022-08-02 DIAGNOSIS — N1831 Chronic kidney disease, stage 3a: Secondary | ICD-10-CM | POA: Diagnosis not present

## 2022-08-02 DIAGNOSIS — J45909 Unspecified asthma, uncomplicated: Secondary | ICD-10-CM | POA: Diagnosis not present

## 2022-08-02 DIAGNOSIS — I10 Essential (primary) hypertension: Secondary | ICD-10-CM | POA: Diagnosis not present

## 2022-08-02 DIAGNOSIS — E785 Hyperlipidemia, unspecified: Secondary | ICD-10-CM | POA: Diagnosis not present

## 2022-08-04 DIAGNOSIS — M25561 Pain in right knee: Secondary | ICD-10-CM | POA: Diagnosis not present

## 2022-08-04 DIAGNOSIS — Z6832 Body mass index (BMI) 32.0-32.9, adult: Secondary | ICD-10-CM | POA: Diagnosis not present

## 2022-08-09 ENCOUNTER — Other Ambulatory Visit (HOSPITAL_BASED_OUTPATIENT_CLINIC_OR_DEPARTMENT_OTHER): Payer: Self-pay

## 2022-08-09 DIAGNOSIS — Z23 Encounter for immunization: Secondary | ICD-10-CM | POA: Diagnosis not present

## 2022-08-09 MED ORDER — FLUAD QUADRIVALENT 0.5 ML IM PRSY
PREFILLED_SYRINGE | INTRAMUSCULAR | 0 refills | Status: DC
  Filled 2022-08-09: qty 0.5, 1d supply, fill #0

## 2022-09-04 DIAGNOSIS — N202 Calculus of kidney with calculus of ureter: Secondary | ICD-10-CM | POA: Diagnosis not present

## 2022-09-28 DIAGNOSIS — J029 Acute pharyngitis, unspecified: Secondary | ICD-10-CM | POA: Diagnosis not present

## 2022-09-28 DIAGNOSIS — R5383 Other fatigue: Secondary | ICD-10-CM | POA: Diagnosis not present

## 2022-09-28 DIAGNOSIS — R112 Nausea with vomiting, unspecified: Secondary | ICD-10-CM | POA: Diagnosis not present

## 2022-09-28 DIAGNOSIS — R630 Anorexia: Secondary | ICD-10-CM | POA: Diagnosis not present

## 2022-09-28 DIAGNOSIS — B349 Viral infection, unspecified: Secondary | ICD-10-CM | POA: Diagnosis not present

## 2022-10-02 DIAGNOSIS — Z6831 Body mass index (BMI) 31.0-31.9, adult: Secondary | ICD-10-CM | POA: Diagnosis not present

## 2022-10-02 DIAGNOSIS — R058 Other specified cough: Secondary | ICD-10-CM | POA: Diagnosis not present

## 2022-10-02 DIAGNOSIS — U071 COVID-19: Secondary | ICD-10-CM | POA: Diagnosis not present

## 2022-10-18 DIAGNOSIS — M25562 Pain in left knee: Secondary | ICD-10-CM | POA: Diagnosis not present

## 2022-10-18 DIAGNOSIS — M25561 Pain in right knee: Secondary | ICD-10-CM | POA: Diagnosis not present

## 2022-10-18 DIAGNOSIS — M1712 Unilateral primary osteoarthritis, left knee: Secondary | ICD-10-CM | POA: Diagnosis not present

## 2022-10-18 DIAGNOSIS — M2392 Unspecified internal derangement of left knee: Secondary | ICD-10-CM | POA: Diagnosis not present

## 2022-10-19 DIAGNOSIS — L821 Other seborrheic keratosis: Secondary | ICD-10-CM | POA: Diagnosis not present

## 2022-10-19 DIAGNOSIS — Z85828 Personal history of other malignant neoplasm of skin: Secondary | ICD-10-CM | POA: Diagnosis not present

## 2022-10-19 DIAGNOSIS — Z859 Personal history of malignant neoplasm, unspecified: Secondary | ICD-10-CM | POA: Diagnosis not present

## 2022-10-19 DIAGNOSIS — L578 Other skin changes due to chronic exposure to nonionizing radiation: Secondary | ICD-10-CM | POA: Diagnosis not present

## 2022-10-19 DIAGNOSIS — L570 Actinic keratosis: Secondary | ICD-10-CM | POA: Diagnosis not present

## 2022-10-24 ENCOUNTER — Other Ambulatory Visit: Payer: Self-pay | Admitting: Cardiology

## 2022-10-25 DIAGNOSIS — D649 Anemia, unspecified: Secondary | ICD-10-CM | POA: Diagnosis not present

## 2022-10-25 DIAGNOSIS — I10 Essential (primary) hypertension: Secondary | ICD-10-CM | POA: Diagnosis not present

## 2022-10-25 DIAGNOSIS — R051 Acute cough: Secondary | ICD-10-CM | POA: Diagnosis not present

## 2022-10-25 DIAGNOSIS — E559 Vitamin D deficiency, unspecified: Secondary | ICD-10-CM | POA: Diagnosis not present

## 2022-10-25 DIAGNOSIS — Z0000 Encounter for general adult medical examination without abnormal findings: Secondary | ICD-10-CM | POA: Diagnosis not present

## 2022-10-25 DIAGNOSIS — E1165 Type 2 diabetes mellitus with hyperglycemia: Secondary | ICD-10-CM | POA: Diagnosis not present

## 2022-10-25 DIAGNOSIS — M8588 Other specified disorders of bone density and structure, other site: Secondary | ICD-10-CM | POA: Diagnosis not present

## 2022-10-25 DIAGNOSIS — E538 Deficiency of other specified B group vitamins: Secondary | ICD-10-CM | POA: Diagnosis not present

## 2022-10-25 DIAGNOSIS — E785 Hyperlipidemia, unspecified: Secondary | ICD-10-CM | POA: Diagnosis not present

## 2022-10-26 ENCOUNTER — Other Ambulatory Visit: Payer: Self-pay | Admitting: Family Medicine

## 2022-10-26 DIAGNOSIS — M8580 Other specified disorders of bone density and structure, unspecified site: Secondary | ICD-10-CM

## 2022-10-27 ENCOUNTER — Other Ambulatory Visit: Payer: Self-pay | Admitting: Family Medicine

## 2022-10-27 DIAGNOSIS — Z1231 Encounter for screening mammogram for malignant neoplasm of breast: Secondary | ICD-10-CM

## 2022-11-29 DIAGNOSIS — R3989 Other symptoms and signs involving the genitourinary system: Secondary | ICD-10-CM | POA: Diagnosis not present

## 2022-11-29 DIAGNOSIS — R300 Dysuria: Secondary | ICD-10-CM | POA: Diagnosis not present

## 2022-11-29 DIAGNOSIS — M2392 Unspecified internal derangement of left knee: Secondary | ICD-10-CM | POA: Diagnosis not present

## 2022-11-29 DIAGNOSIS — M1712 Unilateral primary osteoarthritis, left knee: Secondary | ICD-10-CM | POA: Diagnosis not present

## 2022-11-29 DIAGNOSIS — M25552 Pain in left hip: Secondary | ICD-10-CM | POA: Diagnosis not present

## 2022-11-29 DIAGNOSIS — M25561 Pain in right knee: Secondary | ICD-10-CM | POA: Diagnosis not present

## 2022-12-08 DIAGNOSIS — E118 Type 2 diabetes mellitus with unspecified complications: Secondary | ICD-10-CM | POA: Diagnosis not present

## 2022-12-15 DIAGNOSIS — E785 Hyperlipidemia, unspecified: Secondary | ICD-10-CM | POA: Diagnosis not present

## 2022-12-15 DIAGNOSIS — E1165 Type 2 diabetes mellitus with hyperglycemia: Secondary | ICD-10-CM | POA: Diagnosis not present

## 2022-12-15 DIAGNOSIS — N1831 Chronic kidney disease, stage 3a: Secondary | ICD-10-CM | POA: Diagnosis not present

## 2022-12-15 DIAGNOSIS — I10 Essential (primary) hypertension: Secondary | ICD-10-CM | POA: Diagnosis not present

## 2023-01-05 DIAGNOSIS — L02414 Cutaneous abscess of left upper limb: Secondary | ICD-10-CM | POA: Diagnosis not present

## 2023-01-05 DIAGNOSIS — Z6831 Body mass index (BMI) 31.0-31.9, adult: Secondary | ICD-10-CM | POA: Diagnosis not present

## 2023-01-08 DIAGNOSIS — N952 Postmenopausal atrophic vaginitis: Secondary | ICD-10-CM | POA: Diagnosis not present

## 2023-01-08 DIAGNOSIS — M25561 Pain in right knee: Secondary | ICD-10-CM | POA: Diagnosis not present

## 2023-01-08 DIAGNOSIS — Z803 Family history of malignant neoplasm of breast: Secondary | ICD-10-CM | POA: Diagnosis not present

## 2023-01-08 DIAGNOSIS — Z01419 Encounter for gynecological examination (general) (routine) without abnormal findings: Secondary | ICD-10-CM | POA: Diagnosis not present

## 2023-01-11 DIAGNOSIS — M25561 Pain in right knee: Secondary | ICD-10-CM | POA: Diagnosis not present

## 2023-01-11 DIAGNOSIS — M25562 Pain in left knee: Secondary | ICD-10-CM | POA: Diagnosis not present

## 2023-01-24 DIAGNOSIS — M25562 Pain in left knee: Secondary | ICD-10-CM | POA: Diagnosis not present

## 2023-02-07 DIAGNOSIS — M25562 Pain in left knee: Secondary | ICD-10-CM | POA: Diagnosis not present

## 2023-02-14 DIAGNOSIS — N200 Calculus of kidney: Secondary | ICD-10-CM

## 2023-02-14 DIAGNOSIS — N201 Calculus of ureter: Secondary | ICD-10-CM

## 2023-02-14 HISTORY — DX: Calculus of ureter: N20.1

## 2023-02-14 HISTORY — DX: Calculus of kidney: N20.0

## 2023-02-20 DIAGNOSIS — R110 Nausea: Secondary | ICD-10-CM | POA: Diagnosis not present

## 2023-02-20 DIAGNOSIS — R197 Diarrhea, unspecified: Secondary | ICD-10-CM | POA: Diagnosis not present

## 2023-02-20 DIAGNOSIS — E1165 Type 2 diabetes mellitus with hyperglycemia: Secondary | ICD-10-CM | POA: Diagnosis not present

## 2023-02-26 DIAGNOSIS — R197 Diarrhea, unspecified: Secondary | ICD-10-CM | POA: Diagnosis not present

## 2023-02-27 ENCOUNTER — Other Ambulatory Visit (HOSPITAL_COMMUNITY): Payer: Self-pay | Admitting: Adult Health

## 2023-02-27 ENCOUNTER — Encounter (HOSPITAL_COMMUNITY): Payer: Self-pay | Admitting: Adult Health

## 2023-02-27 DIAGNOSIS — N201 Calculus of ureter: Secondary | ICD-10-CM

## 2023-02-27 DIAGNOSIS — N130 Hydronephrosis with ureteropelvic junction obstruction: Secondary | ICD-10-CM

## 2023-02-27 DIAGNOSIS — N132 Hydronephrosis with renal and ureteral calculous obstruction: Secondary | ICD-10-CM | POA: Diagnosis not present

## 2023-02-28 ENCOUNTER — Inpatient Hospital Stay (HOSPITAL_COMMUNITY): Payer: Medicare Other

## 2023-02-28 ENCOUNTER — Ambulatory Visit (HOSPITAL_COMMUNITY)
Admission: RE | Admit: 2023-02-28 | Discharge: 2023-02-28 | Disposition: A | Discharge status: Home / Self Care | Payer: Medicare Other | Source: Ambulatory Visit | Attending: Urology | Admitting: Urology

## 2023-02-28 ENCOUNTER — Other Ambulatory Visit: Payer: Self-pay | Admitting: Urology

## 2023-02-28 ENCOUNTER — Other Ambulatory Visit: Payer: Self-pay

## 2023-02-28 ENCOUNTER — Inpatient Hospital Stay (HOSPITAL_COMMUNITY): Payer: Medicare Other | Admitting: Certified Registered"

## 2023-02-28 ENCOUNTER — Ambulatory Visit (HOSPITAL_BASED_OUTPATIENT_CLINIC_OR_DEPARTMENT_OTHER): Admission: RE | Admit: 2023-02-28 | Payer: Medicare Other | Source: Ambulatory Visit

## 2023-02-28 ENCOUNTER — Encounter (HOSPITAL_COMMUNITY)
Admission: RE | Disposition: A | Discharge status: Home / Self Care | Payer: Self-pay | Source: Ambulatory Visit | Attending: Urology

## 2023-02-28 ENCOUNTER — Encounter (HOSPITAL_COMMUNITY): Payer: Self-pay | Admitting: Urology

## 2023-02-28 DIAGNOSIS — Z79899 Other long term (current) drug therapy: Secondary | ICD-10-CM | POA: Insufficient documentation

## 2023-02-28 DIAGNOSIS — N1330 Unspecified hydronephrosis: Secondary | ICD-10-CM | POA: Diagnosis not present

## 2023-02-28 DIAGNOSIS — Z7984 Long term (current) use of oral hypoglycemic drugs: Secondary | ICD-10-CM | POA: Insufficient documentation

## 2023-02-28 DIAGNOSIS — I10 Essential (primary) hypertension: Secondary | ICD-10-CM

## 2023-02-28 DIAGNOSIS — J45909 Unspecified asthma, uncomplicated: Secondary | ICD-10-CM | POA: Insufficient documentation

## 2023-02-28 DIAGNOSIS — E119 Type 2 diabetes mellitus without complications: Secondary | ICD-10-CM | POA: Insufficient documentation

## 2023-02-28 DIAGNOSIS — N200 Calculus of kidney: Secondary | ICD-10-CM | POA: Diagnosis not present

## 2023-02-28 DIAGNOSIS — N201 Calculus of ureter: Secondary | ICD-10-CM

## 2023-02-28 DIAGNOSIS — N132 Hydronephrosis with renal and ureteral calculous obstruction: Secondary | ICD-10-CM | POA: Diagnosis not present

## 2023-02-28 DIAGNOSIS — K219 Gastro-esophageal reflux disease without esophagitis: Secondary | ICD-10-CM | POA: Insufficient documentation

## 2023-02-28 DIAGNOSIS — N202 Calculus of kidney with calculus of ureter: Secondary | ICD-10-CM | POA: Diagnosis not present

## 2023-02-28 DIAGNOSIS — N209 Urinary calculus, unspecified: Secondary | ICD-10-CM | POA: Diagnosis not present

## 2023-02-28 DIAGNOSIS — R8271 Bacteriuria: Secondary | ICD-10-CM | POA: Diagnosis not present

## 2023-02-28 HISTORY — PX: CYSTOSCOPY WITH STENT PLACEMENT: SHX5790

## 2023-02-28 LAB — POCT I-STAT, CHEM 8
BUN: 21 mg/dL (ref 8–23)
Calcium, Ion: 1.23 mmol/L (ref 1.15–1.40)
Chloride: 101 mmol/L (ref 98–111)
Creatinine, Ser: 1.4 mg/dL — ABNORMAL HIGH (ref 0.44–1.00)
Glucose, Bld: 237 mg/dL — ABNORMAL HIGH (ref 70–99)
HCT: 37 % (ref 36.0–46.0)
Hemoglobin: 12.6 g/dL (ref 12.0–15.0)
Potassium: 4.4 mmol/L (ref 3.5–5.1)
Sodium: 137 mmol/L (ref 135–145)
TCO2: 25 mmol/L (ref 22–32)

## 2023-02-28 LAB — CBC
HCT: 38.7 % (ref 36.0–46.0)
Hemoglobin: 12.6 g/dL (ref 12.0–15.0)
MCH: 31.4 pg (ref 26.0–34.0)
MCHC: 32.6 g/dL (ref 30.0–36.0)
MCV: 96.5 fL (ref 80.0–100.0)
Platelets: 241 10*3/uL (ref 150–400)
RBC: 4.01 MIL/uL (ref 3.87–5.11)
RDW: 13.1 % (ref 11.5–15.5)
WBC: 12.2 10*3/uL — ABNORMAL HIGH (ref 4.0–10.5)
nRBC: 0 % (ref 0.0–0.2)

## 2023-02-28 LAB — GLUCOSE, CAPILLARY
Glucose-Capillary: 208 mg/dL — ABNORMAL HIGH (ref 70–99)
Glucose-Capillary: 199 mg/dL — ABNORMAL HIGH (ref 70–99)
Glucose-Capillary: 216 mg/dL — ABNORMAL HIGH (ref 70–99)

## 2023-02-28 LAB — BASIC METABOLIC PANEL
Anion gap: 14 (ref 5–15)
BUN: 22 mg/dL (ref 8–23)
CO2: 21 mmol/L — ABNORMAL LOW (ref 22–32)
Calcium: 8.9 mg/dL (ref 8.9–10.3)
Chloride: 99 mmol/L (ref 98–111)
Creatinine, Ser: 1.41 mg/dL — ABNORMAL HIGH (ref 0.44–1.00)
GFR, Estimated: 39 mL/min — ABNORMAL LOW (ref >60)
Glucose, Bld: 227 mg/dL — ABNORMAL HIGH (ref 70–99)
Potassium: 4.2 mmol/L (ref 3.5–5.1)
Sodium: 134 mmol/L — ABNORMAL LOW (ref 135–145)

## 2023-02-28 SURGERY — CYSTOSCOPY, WITH STENT INSERTION
Anesthesia: General | Laterality: Right

## 2023-02-28 MED ORDER — SUCCINYLCHOLINE CHLORIDE 200 MG/10ML IV SOSY
PREFILLED_SYRINGE | INTRAVENOUS | Status: AC
  Filled 2023-02-28: qty 10

## 2023-02-28 MED ORDER — ONDANSETRON HCL 4 MG/2ML IJ SOLN: 4.0000 mg | Freq: Once | INTRAMUSCULAR | Status: DC | PRN

## 2023-02-28 MED ORDER — LIDOCAINE HCL (PF) 2 % IJ SOLN
INTRAMUSCULAR | Status: AC
Start: 2023-02-28 — End: ?
  Filled 2023-02-28: qty 5

## 2023-02-28 MED ORDER — DIPHENHYDRAMINE HCL 50 MG/ML IJ SOLN
INTRAMUSCULAR | Status: AC
  Filled 2023-02-28: qty 1

## 2023-02-28 MED ORDER — DEXAMETHASONE SODIUM PHOSPHATE 10 MG/ML IJ SOLN
INTRAMUSCULAR | Status: AC
  Filled 2023-02-28: qty 1

## 2023-02-28 MED ORDER — INSULIN ASPART 100 UNIT/ML IJ SOLN
INTRAMUSCULAR | Status: AC
  Filled 2023-02-28: qty 1

## 2023-02-28 MED ORDER — FENTANYL CITRATE (PF) 250 MCG/5ML IJ SOLN
INTRAMUSCULAR | Status: DC | PRN
  Administered 2023-02-28: 50 ug via INTRAVENOUS

## 2023-02-28 MED ORDER — STERILE WATER FOR IRRIGATION IR SOLN
Status: DC | PRN
  Administered 2023-02-28: 3000 mL

## 2023-02-28 MED ORDER — PHENYLEPHRINE HCL (PRESSORS) 10 MG/ML IV SOLN
INTRAVENOUS | Status: DC | PRN
  Administered 2023-02-28: 80 ug via INTRAVENOUS

## 2023-02-28 MED ORDER — ONDANSETRON HCL 4 MG/2ML IJ SOLN
4.0000 mg | Freq: Once | INTRAMUSCULAR | Status: AC
  Administered 2023-02-28: 4 mg via INTRAVENOUS
  Filled 2023-02-28: qty 2

## 2023-02-28 MED ORDER — AMISULPRIDE (ANTIEMETIC) 5 MG/2ML IV SOLN: 10.0000 mg | Freq: Once | INTRAVENOUS | Status: DC | PRN

## 2023-02-28 MED ORDER — METOPROLOL TARTRATE 5 MG/5ML IV SOLN
INTRAVENOUS | Status: DC | PRN
  Administered 2023-02-28: 1 mg via INTRAVENOUS
  Administered 2023-02-28 (×2): 2 mg via INTRAVENOUS

## 2023-02-28 MED ORDER — INSULIN ASPART 100 UNIT/ML IJ SOLN
5.0000 [IU] | Freq: Once | INTRAMUSCULAR | Status: AC
  Administered 2023-02-28: 5 [IU] via SUBCUTANEOUS

## 2023-02-28 MED ORDER — SUCCINYLCHOLINE CHLORIDE 200 MG/10ML IV SOSY
PREFILLED_SYRINGE | INTRAVENOUS | Status: DC | PRN
Start: 2023-02-28 — End: 2023-02-28
  Administered 2023-02-28: 140 mg via INTRAVENOUS

## 2023-02-28 MED ORDER — LIDOCAINE 2% (20 MG/ML) 5 ML SYRINGE
INTRAMUSCULAR | Status: DC | PRN
  Administered 2023-02-28: 60 mg via INTRAVENOUS

## 2023-02-28 MED ORDER — ACETAMINOPHEN 10 MG/ML IV SOLN
INTRAVENOUS | Status: AC
  Filled 2023-02-28: qty 100

## 2023-02-28 MED ORDER — PROPOFOL 10 MG/ML IV BOLUS
INTRAVENOUS | Status: AC
  Filled 2023-02-28: qty 20

## 2023-02-28 MED ORDER — OXYCODONE HCL 5 MG PO TABS: 5.0000 mg | ORAL_TABLET | Freq: Once | ORAL | Status: DC | PRN

## 2023-02-28 MED ORDER — PROPOFOL 10 MG/ML IV BOLUS
INTRAVENOUS | Status: DC | PRN
  Administered 2023-02-28: 150 mg via INTRAVENOUS

## 2023-02-28 MED ORDER — DEXAMETHASONE SODIUM PHOSPHATE 10 MG/ML IJ SOLN
INTRAMUSCULAR | Status: DC | PRN
  Administered 2023-02-28: 10 mg via INTRAVENOUS

## 2023-02-28 MED ORDER — FENTANYL CITRATE PF 50 MCG/ML IJ SOSY: 25.0000 ug | PREFILLED_SYRINGE | INTRAMUSCULAR | Status: DC | PRN

## 2023-02-28 MED ORDER — LACTATED RINGERS IV SOLN: INTRAVENOUS | Status: DC | PRN

## 2023-02-28 MED ORDER — ONDANSETRON HCL 4 MG/2ML IJ SOLN
INTRAMUSCULAR | Status: AC
  Filled 2023-02-28: qty 2

## 2023-02-28 MED ORDER — METOPROLOL TARTRATE 5 MG/5ML IV SOLN
INTRAVENOUS | Status: AC
  Filled 2023-02-28: qty 5

## 2023-02-28 MED ORDER — FENTANYL CITRATE (PF) 100 MCG/2ML IJ SOLN
INTRAMUSCULAR | Status: AC
  Filled 2023-02-28: qty 2

## 2023-02-28 MED ORDER — ACETAMINOPHEN 10 MG/ML IV SOLN
INTRAVENOUS | Status: DC | PRN
  Administered 2023-02-28: 1000 mg via INTRAVENOUS

## 2023-02-28 MED ORDER — INSULIN ASPART 100 UNIT/ML IJ SOLN
5.0000 [IU] | Freq: Once | INTRAMUSCULAR | Status: AC
  Administered 2023-02-28: 5 [IU] via SUBCUTANEOUS
  Filled 2023-02-28: qty 1

## 2023-02-28 MED ORDER — ACETAMINOPHEN 500 MG PO TABS: 1000.0000 mg | ORAL_TABLET | Freq: Once | ORAL | Status: DC

## 2023-02-28 MED ORDER — PHENYLEPHRINE 80 MCG/ML (10ML) SYRINGE FOR IV PUSH (FOR BLOOD PRESSURE SUPPORT)
PREFILLED_SYRINGE | INTRAVENOUS | Status: AC
  Filled 2023-02-28: qty 10

## 2023-02-28 MED ORDER — DIPHENHYDRAMINE HCL 50 MG/ML IJ SOLN
INTRAMUSCULAR | Status: DC | PRN
  Administered 2023-02-28: 12.5 mg via INTRAVENOUS

## 2023-02-28 MED ORDER — CEFAZOLIN SODIUM-DEXTROSE 2-4 GM/100ML-% IV SOLN
2.0000 g | INTRAVENOUS | Status: AC
  Administered 2023-02-28: 2 g via INTRAVENOUS
  Filled 2023-02-28: qty 100

## 2023-02-28 MED ORDER — CEPHALEXIN 500 MG PO CAPS: 500.0000 mg | ORAL_CAPSULE | Freq: Four times a day (QID) | ORAL | 0 refills | Status: AC

## 2023-02-28 MED ORDER — TRAMADOL HCL 50 MG PO TABS: 50.0000 mg | ORAL_TABLET | Freq: Four times a day (QID) | ORAL | 0 refills | Status: DC | PRN

## 2023-02-28 MED ORDER — OXYCODONE HCL 5 MG/5ML PO SOLN: 5.0000 mg | Freq: Once | ORAL | Status: DC | PRN

## 2023-02-28 SURGICAL SUPPLY — 13 items
BAG URO CATCHER STRL LF (MISCELLANEOUS) ×1 IMPLANT
CATH URETL OPEN END 6FR 70 (CATHETERS) IMPLANT
CLOTH BEACON ORANGE TIMEOUT ST (SAFETY) ×1 IMPLANT
GLOVE SURG LX STRL 7.5 STRW (GLOVE) ×1 IMPLANT
GOWN STRL REUS W/ TWL XL LVL3 (GOWN DISPOSABLE) ×1 IMPLANT
GOWN STRL REUS W/TWL XL LVL3 (GOWN DISPOSABLE) ×1
GUIDEWIRE STR DUAL SENSOR (WIRE) ×1 IMPLANT
GUIDEWIRE ZIPWRE .038 STRAIGHT (WIRE) IMPLANT
KIT TURNOVER KIT A (KITS) IMPLANT
MANIFOLD NEPTUNE II (INSTRUMENTS) ×1 IMPLANT
PACK CYSTO (CUSTOM PROCEDURE TRAY) ×1 IMPLANT
TUBING CONNECTING 10 (TUBING) ×1 IMPLANT
TUBING UROLOGY SET (TUBING) IMPLANT

## 2023-02-28 NOTE — Op Note (Signed)
Preoperative diagnosis:  Right ureteral stone   Postoperative diagnosis:  Right ureteral stone   Procedure:  Cystoscopy Right ureteral stent placement (6 x 24 - no string)   Surgeon: Rolly Salter, Montez Hageman. M.D.  Anesthesia: General  Complications: None  EBL: Minimal  Specimens: None  Indication: Tiffany Weeks is a 72 y.o. patient with a right ureteral stone and unrelenting pain and nausea. After reviewing the management options for treatment, he elected to proceed with the above surgical procedure(s). We have discussed the potential benefits and risks of the procedure, side effects of the proposed treatment, the likelihood of the patient achieving the goals of the procedure, and any potential problems that might occur during the procedure or recuperation. Informed consent has been obtained.  Description of procedure:  The patient was taken to the operating room and general anesthesia was induced.  The patient was placed in the dorsal lithotomy position, prepped and draped in the usual sterile fashion, and preoperative antibiotics were administered. A preoperative time-out was performed.   Cystourethroscopy was performed.  The patient's urethra was examined and was normal. The bladder was then systematically examined in its entirety. There was no evidence for any bladder tumors, stones, or other mucosal pathology.    A 0.38 sensor guidewire was then advanced up the right ureter into the renal pelvis under fluoroscopic guidance.  The wire was then backloaded through the cystoscope and a ureteral stent was advance over the wire using Seldinger technique.  The stent was positioned appropriately under fluoroscopic and cystoscopic guidance.  The wire was then removed with an adequate stent curl noted in the renal pelvis as well as in the bladder.  The bladder was then emptied and the procedure ended.  The patient appeared to tolerate the procedure well and without complications.  The patient  was able to be awakened and transferred to the recovery unit in satisfactory condition.    Moody Bruins MD

## 2023-02-28 NOTE — H&P (Signed)
Office Visit Report     02/28/2023   --------------------------------------------------------------------------------   Tiffany Weeks  MRN: [id number]  DOB: 22-Oct-1950, 72 year old Female  SSN:    PRIMARY CARE:  Jackelyn Poling, DO  PRIMARY CARE FAX:  [phone]  REFERRING:  Bartholomew Crews, NP  PROVIDER:  Bjorn Pippin, M.D.  TREATING:  Bartholomew Crews, NP  LOCATION:  [facility] [phone]     --------------------------------------------------------------------------------   CC: I have ureteral stone.  HPI: Tiffany Weeks is a 72 year-old female established patient who is here for ureteral stone.  02/28/2023: 72 year old female who was seen in clinic yesterday and found to have mild left-sided hydronephrosis with a potential left ureteral/UPJ stone presents today with worsening symptoms overnight. She is actually having right sided pain and not left sided pain. She began having nausea and vomiting through the night. She denies fevers and chills. She has been unable to hold any fluids or food down. She last ate last night and last had something to drink last night.   02/27/2023: 72 year old female who presents today for evaluation and management of nephrolithiasis. Over the past 3 to 4 weeks she has been experiencing nausea. This is associated with some flank pain. She denies changes to her voiding habits, fevers, chills. She denies gross hematuria. Her urinalysis today has 20-40 RBCs. She had nausea with her last stone event. Renal ultrasound does show concern for some mild hydronephrosis of her left kidney. She denies fevers and chills.   09/04/2022: 72 year old female who presents today for evaluation and management of nephrolithiasis. She has been doing well since her prior office visit and denies passage of stone material, fevers, chills.   03/01/2022: 72 year old female who presents today for office visit and evaluation of nephrolithiasis. She is doing well and denies  interval flank pain or discomfort. Her urine is concerning for an infectious process today and she reports that she has had some intermittent dysuria that began 2 days ago associated with increased frequency. She denies fevers, chills, gross hematuria.   09/05/2021: Tiffany Weeks presents today for follow-up regarding a 2 mm left proximal stone. She has not seen a stone pass. At last office visit, she was given Phenergan for her nausea. She reports her nausea has improved however, she believes it is due to the Phenergan. She continues to have intermittent left-sided flank pain and discomfort. She denies fevers and chills. She denies gross hematuria.   Tiffany Weeks is a 72 yo female was seen in the ER on 11/1 for severe left flank pain and nausea. She had a 2mm left proximal stone with mild obstruction and a small LUP stone. She hasn't had pain since 11/2 but she still has nausea. She has had no hematuria. She had had some urgency on Weds but that has resolved. She had lower back pain for 2 days. Her UA today has some pyuria. She had no fever. She has no prior GU history. She was given tramadol, tamsulosin and zofran in the ER.     ALLERGIES: Align Amoxicillin Augmentin Cipro - Nausea Losartan-Hydrochlorothiazide Naproxen Surgical TAPE Zenpepp    MEDICATIONS: Metformin Hcl 500 mg tablet  Metoprolol Tartrate 50 mg tablet  Benicar 40 mg tablet  Glimepiride 1 mg tablet  Iron  Norvasc 5 mg tablet  Pantoprazole Sodium 40 mg tablet, delayed release  Promethazine Hcl 6.25 mg/5 ml syrup 15 ml PO Q 6 H PRN as needed for nausea  Systane  Vitamin B12  Vitamin D2  GU PSH: None   NON-GU PSH: Cataract surgery Cholecystectomy (open) - 1999     GU PMH: Renal calculus - 02/27/2023, - 09/04/2022, - 03/01/2022, - 09/05/2021, She has a small LUP stone. , - 08/22/2021 Ureteral calculus - 02/27/2023, - 09/04/2022, - 03/01/2022, - 09/05/2021, She still has nausea and is a little tender on exam. The stone may not have  passed. I will refill the tamsulosin and give her phenergan po since the zofran has not been effective. She will return in 1-2 weeks and if she remains symptomatic, she will need a CT to further assess the stone. , - 08/22/2021 Ureteral obstruction secondary to calculous - 02/27/2023 Acute Cystitis/UTI - 03/01/2022    NON-GU PMH: Nausea - 08/22/2021 Arthritis Asthma Cardiac murmur, unspecified Diabetes Type 2 GERD Glaucoma Hypercholesterolemia Hypertension    FAMILY HISTORY: Breast Cancer - Sister Death In The Family Father - Other Death In The Family Mother - Other Diabetes - Mother heart - Other High Blood Pressure - Mother   SOCIAL HISTORY: Marital Status: Single Preferred Language: English; Ethnicity: Not Hispanic Or Latino; Race: White Current Smoking Status: Patient has never smoked.   Tobacco Use Assessment Completed: Used Tobacco in last 30 days? Does not use smokeless tobacco. Has never drank.  Does not use drugs. Drinks 1 caffeinated drink per day.    REVIEW OF SYSTEMS:    GU Review Female:   Patient denies trouble starting your stream, hard to postpone urination, stream starts and stops, have to strain to urinate, frequent urination, leakage of urine, get up at night to urinate, burning /pain with urination, and being pregnant.  Gastrointestinal (Upper):   Patient reports nausea and vomiting. Patient denies indigestion/ heartburn.  Gastrointestinal (Lower):   Patient denies diarrhea and constipation.  Constitutional:   Patient denies fever, night sweats, weight loss, and fatigue.  Skin:   Patient denies skin rash/ lesion and itching.  Ears/ Nose/ Throat:   Patient denies sore throat and sinus problems.  Hematologic/Lymphatic:   Patient denies swollen glands and easy bruising.  Musculoskeletal:   Patient reports back pain. Patient denies joint pain.  Neurological:   Patient denies headaches and dizziness.  Psychologic:   Patient denies depression and anxiety.    Notes: Right back pain since 5am today.     VITAL SIGNS:      02/28/2023 09:47 AM  BP 162/79 mmHg  Pulse 81 /min  Temperature 97.5 F / 36.3 C   MULTI-SYSTEM PHYSICAL EXAMINATION:    Constitutional: Actively vomiting in clinic.  Respiratory: No labored breathing, no use of accessory muscles.   Cardiovascular: Normal temperature, normal extremity pulses, no swelling, no varicosities.  Skin: Skin pale. No jaundice, no cyanosis. No lesion, no ulcer, no rash.   Neurologic / Psychiatric: Oriented to time, oriented to place, oriented to person. No depression, no anxiety, no agitation.  Gastrointestinal: No mass, no tenderness, no rigidity, non obese abdomen.   Notes: Tenderness to right CVA and right upper abdomen.     Complexity of Data:  Source Of History:  Patient  Records Review:   Previous Doctor Records, Previous Patient Records  Urine Test Review:   Urinalysis   02/28/23  Urinalysis  Urine Appearance Slightly Cloudy   Urine Color Yellow   Urine Glucose 3+ mg/dL  Urine Bilirubin Neg mg/dL  Urine Ketones Trace mg/dL  Urine Specific Gravity 1.020   Urine Blood 3+ ery/uL  Urine pH 5.5   Urine Protein Trace mg/dL  Urine Urobilinogen 0.2 mg/dL  Urine  Nitrites Neg   Urine Leukocyte Esterase Neg leu/uL  Urine WBC/hpf 0 - 5/hpf   Urine RBC/hpf >60/hpf   Urine Epithelial Cells 0 - 5/hpf   Urine Bacteria Mod (26-50/hpf)   Urine Mucous Not Present   Urine Yeast NS (Not Seen)   Urine Trichomonas Not Present   Urine Cystals NS (Not Seen)   Urine Casts NS (Not Seen)   Urine Sperm Not Present    PROCEDURES:         C.T. Urogram - [id number]      Patient confirmed No Neulasta OnPro Device.         Morphine 4mg  - [id number], J2270 Morphine 4mg  given IM in left buttock without difficulty. Patient observed for 5-10 minutes.  None wasted.   NDC 1610-9604-54   Qty: 4 Adm. By: Juel Burrow  Unit: mg Lot No [id number]  Route: IM Exp. Date 05/16/2024  Freq: None Mfgr.:   Site: Left  Buttock         Phenergan 25mg  - J2550, [id number] Phenergan 25 mg given in right buttock without difficulty. Patient observed for 5-10 minutes. None wasted.  NBC [id number]   Qty: 25 Adm. By: Juel Burrow  Unit: mg Lot No [id number]  Route: IM Exp. Date 06/17/2023  Freq: None Mfgr.:   Site: Right Buttock   ASSESSMENT:      ICD-10 Details  1 GU:   Ureteral calculus - N20.1 Bilateral, Acute, Systemic Symptoms  2   Renal calculus - N20.0 Bilateral, Acute, Systemic Symptoms   PLAN:           Orders Labs CULTURE, URINE, BUN/Creatinine(Stat)          Schedule Procedure: 02/28/2023 at [facility] - [phone] - Phenergan 25mg  (Phenergan Per 50 Mg) - J2550, [id number]  Procedure: 02/28/2023 at [facility] - [phone] - Morphine 4mg  (Ther/Proph/Diag Inj, Mendes/Im) - 24401, U2725          Document Letter(s):  Created for Patient: Clinical Summary         Notes:   Urine was sent for culture today. She is actively vomiting in clinic and severe pain. She was given an IM dose of 25 mg of Phenergan as well as 4 mg of morphine. She continues to endorse discomfort. Her CT imaging shows some mild hydronephrosis of her right kidney with stranding and a ureteral stone measuring approximately 3 to 4 mm. She also has a left large partial staghorn that does appear to have some slight fullness of her renal pelvis.   BUN and creatinine were drawn today in anticipation of possible bilateral urgent stent placement. Her prior BMP from a year ago shows a creatinine of 1.11 and a GFR of 53.   She was consented today for right ureteral stent with potential for bilateral ureteral stents. She understands the risks of the procedure including the risk of reaction to anesthesia, risk for worsening pain, and risk of infection. She will need next available follow-up to discuss and address definitive stone intervention.        Next Appointment:      Next Appointment: 03/09/2023 01:45 PM     Appointment Type: Postoperative Appointment    Location: [facility] [phone]    Provider: Bartholomew Crews, NP    Reason for Visit: PO      * Signed by Bartholomew Crews, NP on 02/28/23 at 12:38 PM (EDT*

## 2023-02-28 NOTE — Anesthesia Preprocedure Evaluation (Addendum)
Anesthesia Evaluation  Patient identified by MRN, date of birth, ID band Patient awake    Reviewed: Allergy & Precautions, NPO status , Patient's Chart, lab work & pertinent test results, reviewed documented beta blocker date and time   Airway Mallampati: IV  TM Distance: >3 FB Neck ROM: Full  Mouth opening: Limited Mouth Opening Comment: Small mouth Dental  (+) Teeth Intact, Dental Advisory Given   Pulmonary asthma    Pulmonary exam normal breath sounds clear to auscultation       Cardiovascular hypertension (181/87, P 105- didnt take meds today as she has been nauseous and vomiting all day), Pt. on medications and Pt. on home beta blockers  Rhythm:Regular Rate:Tachycardia  Echo 12/2021  1. The left ventricle has hyperdynamic function. The left ventricle has  no regional wall motion abnormalities. There is mild concentric left  ventricular hypertrophy. Left ventricular diastolic parameters are  consistent with Grade I diastolic dysfunction   (impaired relaxation).   2. Right ventricular systolic function is normal. The right ventricular  size is normal. There is normal pulmonary artery systolic pressure.   3. The mitral valve is normal in structure. No evidence of mitral valve  regurgitation. No evidence of mitral stenosis.   4. The aortic valve is normal in structure. Aortic valve regurgitation is  not visualized. No aortic stenosis is present.   5. The inferior vena cava is normal in size with greater than 50%  respiratory variability, suggesting right atrial pressure of 3 mmHg.      Neuro/Psych negative neurological ROS  negative psych ROS   GI/Hepatic Neg liver ROS,GERD  Medicated and Controlled,,  Endo/Other  diabetes, Well Controlled, Type 2, Oral Hypoglycemic Agents  FS 208 preop, has not had labs for years   Renal/GU Renal disease (bilateral calcuili)  negative genitourinary   Musculoskeletal  (+) Arthritis ,  Osteoarthritis,    Abdominal  (+) + obese  Peds  Hematology negative hematology ROS (+)   Anesthesia Other Findings   Reproductive/Obstetrics negative OB ROS                              Anesthesia Physical Anesthesia Plan  ASA: 3  Anesthesia Plan: General   Post-op Pain Management: Tylenol PO (pre-op)*   Induction: Intravenous, Rapid sequence and Cricoid pressure planned  PONV Risk Score and Plan: 3 and Ondansetron, Dexamethasone, Treatment may vary due to age or medical condition and Midazolam  Airway Management Planned: Oral ETT  Additional Equipment: None  Intra-op Plan:   Post-operative Plan: Extubation in OR  Informed Consent: I have reviewed the patients History and Physical, chart, labs and discussed the procedure including the risks, benefits and alternatives for the proposed anesthesia with the patient or authorized representative who has indicated his/her understanding and acceptance.     Dental advisory given  Plan Discussed with: CRNA  Anesthesia Plan Comments: (FS 208- treated w/ 5 units short acting insulin preop, will recheck intra-op N/V- states zofran made her nausea worse years ago, discussed w/ patient trying this medication again. Will RSI/glide given non-reassuring airway exam and need for RSI)        Anesthesia Quick Evaluation

## 2023-02-28 NOTE — Progress Notes (Addendum)
Patient instructed to arrive at 1645 for surgery today.  Patient instructed to arrive at [facility] main entrance and check in with admitting.  Patient verbalized understanding.     2:32 PM Called patient to come in as soon as she can for surgery,  patient stated she is still at [facility] urology and should be here soon.

## 2023-02-28 NOTE — Discharge Instructions (Addendum)

## 2023-02-28 NOTE — Transfer of Care (Signed)
Immediate Anesthesia Transfer of Care Note  Patient: Tiffany Weeks  Procedure(s) Performed: CYSTOSCOPY WITH RIGHT STENT PLACEMENT (Right)  Patient Location: PACU  Anesthesia Type:General  Level of Consciousness: oriented, drowsy, and patient cooperative  Airway & Oxygen Therapy: Patient Spontanous Breathing and Patient connected to face mask oxygen  Post-op Assessment: Report given to RN and Post -op Vital signs reviewed and stable  Post vital signs: Reviewed  Last Vitals:  Vitals Value Taken Time  BP 146/69 02/28/23 1638  Temp 36.8 C 02/28/23 1638  Pulse 92 02/28/23 1644  Resp 19 02/28/23 1644  SpO2 100 % 02/28/23 1644  Vitals shown include unvalidated device data.  Last Pain:  Vitals:   02/28/23 1550  TempSrc:   PainSc: 5          Complications: No notable events documented.

## 2023-02-28 NOTE — Anesthesia Procedure Notes (Signed)
Procedure Name: Intubation Date/Time: 02/28/2023 4:07 PM  Performed by: Lovie Chol, CRNAPre-anesthesia Checklist: Patient identified, Emergency Drugs available, Suction available and Patient being monitored Patient Re-evaluated:Patient Re-evaluated prior to induction Oxygen Delivery Method: Circle System Utilized Preoxygenation: Pre-oxygenation with 100% oxygen Induction Type: IV induction, Rapid sequence and Cricoid Pressure applied Laryngoscope Size: Glidescope and 3 Grade View: Grade I Tube type: Oral Tube size: 7.0 mm Number of attempts: 1 Airway Equipment and Method: Stylet and Oral airway Placement Confirmation: ETT inserted through vocal cords under direct vision, positive ETCO2 and breath sounds checked- equal and bilateral Secured at: 21 cm Tube secured with: Tape Dental Injury: Teeth and Oropharynx as per pre-operative assessment

## 2023-02-28 NOTE — Anesthesia Postprocedure Evaluation (Signed)
Anesthesia Post Note  Patient: Tiffany Weeks  Procedure(s) Performed: CYSTOSCOPY WITH RIGHT STENT PLACEMENT (Right)     Patient location during evaluation: PACU Anesthesia Type: General Level of consciousness: awake and alert, oriented and patient cooperative Pain management: pain level controlled Vital Signs Assessment: post-procedure vital signs reviewed and stable Respiratory status: spontaneous breathing, nonlabored ventilation and respiratory function stable Cardiovascular status: blood pressure returned to baseline and stable Postop Assessment: no apparent nausea or vomiting Anesthetic complications: no Comments: FS down to 199 in PACU, trending down. Not on home insulin, discharged w/ instructions to take all DM meds when she gets home   No notable events documented.  Last Vitals:  Vitals:   02/28/23 1709 02/28/23 1715  BP:  (!) 150/74  Pulse:  85  Resp:  14  Temp:    SpO2: 100% 94%    Last Pain:  Vitals:   02/28/23 1715  TempSrc:   PainSc: 0-No pain                 Lannie Fields

## 2023-03-01 ENCOUNTER — Encounter (HOSPITAL_COMMUNITY): Payer: Self-pay | Admitting: Urology

## 2023-03-09 DIAGNOSIS — R8271 Bacteriuria: Secondary | ICD-10-CM | POA: Diagnosis not present

## 2023-03-09 DIAGNOSIS — N202 Calculus of kidney with calculus of ureter: Secondary | ICD-10-CM | POA: Diagnosis not present

## 2023-03-14 ENCOUNTER — Other Ambulatory Visit: Payer: Self-pay | Admitting: Urology

## 2023-03-16 ENCOUNTER — Other Ambulatory Visit: Payer: Self-pay

## 2023-03-16 ENCOUNTER — Ambulatory Visit (HOSPITAL_COMMUNITY): Payer: Medicare Other | Admitting: Certified Registered"

## 2023-03-16 ENCOUNTER — Ambulatory Visit (HOSPITAL_BASED_OUTPATIENT_CLINIC_OR_DEPARTMENT_OTHER): Payer: Medicare Other | Admitting: Certified Registered"

## 2023-03-16 ENCOUNTER — Encounter (HOSPITAL_COMMUNITY)
Admission: AD | Disposition: A | Discharge status: Home / Self Care | Payer: Self-pay | Source: Ambulatory Visit | Attending: Urology

## 2023-03-16 ENCOUNTER — Other Ambulatory Visit: Payer: Self-pay | Admitting: Urology

## 2023-03-16 ENCOUNTER — Ambulatory Visit (HOSPITAL_COMMUNITY): Payer: Medicare Other

## 2023-03-16 ENCOUNTER — Ambulatory Visit (HOSPITAL_COMMUNITY)
Admission: AD | Admit: 2023-03-16 | Discharge: 2023-03-16 | Disposition: A | Discharge status: Home / Self Care | Payer: Medicare Other | Source: Ambulatory Visit | Attending: Urology | Admitting: Urology

## 2023-03-16 ENCOUNTER — Encounter (HOSPITAL_BASED_OUTPATIENT_CLINIC_OR_DEPARTMENT_OTHER): Payer: Self-pay | Admitting: Urology

## 2023-03-16 ENCOUNTER — Encounter (HOSPITAL_COMMUNITY): Payer: Self-pay | Admitting: Urology

## 2023-03-16 DIAGNOSIS — E1122 Type 2 diabetes mellitus with diabetic chronic kidney disease: Secondary | ICD-10-CM | POA: Diagnosis not present

## 2023-03-16 DIAGNOSIS — Z7984 Long term (current) use of oral hypoglycemic drugs: Secondary | ICD-10-CM

## 2023-03-16 DIAGNOSIS — N201 Calculus of ureter: Secondary | ICD-10-CM

## 2023-03-16 DIAGNOSIS — N189 Chronic kidney disease, unspecified: Secondary | ICD-10-CM | POA: Diagnosis not present

## 2023-03-16 DIAGNOSIS — I129 Hypertensive chronic kidney disease with stage 1 through stage 4 chronic kidney disease, or unspecified chronic kidney disease: Secondary | ICD-10-CM | POA: Diagnosis not present

## 2023-03-16 DIAGNOSIS — E119 Type 2 diabetes mellitus without complications: Secondary | ICD-10-CM | POA: Insufficient documentation

## 2023-03-16 DIAGNOSIS — I10 Essential (primary) hypertension: Secondary | ICD-10-CM | POA: Diagnosis not present

## 2023-03-16 DIAGNOSIS — N200 Calculus of kidney: Secondary | ICD-10-CM | POA: Diagnosis not present

## 2023-03-16 DIAGNOSIS — R8271 Bacteriuria: Secondary | ICD-10-CM | POA: Diagnosis not present

## 2023-03-16 DIAGNOSIS — N202 Calculus of kidney with calculus of ureter: Secondary | ICD-10-CM | POA: Diagnosis not present

## 2023-03-16 HISTORY — PX: CYSTOSCOPY WITH STENT PLACEMENT: SHX5790

## 2023-03-16 LAB — GLUCOSE, CAPILLARY: Glucose-Capillary: 161 mg/dL — ABNORMAL HIGH (ref 70–99)

## 2023-03-16 SURGERY — CYSTOSCOPY, WITH STENT INSERTION
Anesthesia: General | Laterality: Left

## 2023-03-16 MED ORDER — DEXAMETHASONE SODIUM PHOSPHATE 10 MG/ML IJ SOLN
INTRAMUSCULAR | Status: DC | PRN
  Administered 2023-03-16: 8 mg via INTRAVENOUS

## 2023-03-16 MED ORDER — PHENYLEPHRINE HCL (PRESSORS) 10 MG/ML IV SOLN
INTRAVENOUS | Status: DC | PRN
  Administered 2023-03-16 (×3): 160 ug via INTRAVENOUS

## 2023-03-16 MED ORDER — LIDOCAINE HCL (CARDIAC) PF 100 MG/5ML IV SOSY
PREFILLED_SYRINGE | INTRAVENOUS | Status: DC | PRN
  Administered 2023-03-16: 100 mg via INTRAVENOUS

## 2023-03-16 MED ORDER — STERILE WATER FOR IRRIGATION IR SOLN
Status: DC | PRN
  Administered 2023-03-16: 3000 mL

## 2023-03-16 MED ORDER — FENTANYL CITRATE (PF) 100 MCG/2ML IJ SOLN
INTRAMUSCULAR | Status: DC | PRN
  Administered 2023-03-16: 100 ug via INTRAVENOUS

## 2023-03-16 MED ORDER — OXYCODONE HCL 5 MG/5ML PO SOLN: 5.0000 mg | Freq: Once | ORAL | Status: DC | PRN

## 2023-03-16 MED ORDER — LACTATED RINGERS IV SOLN: INTRAVENOUS | Status: DC

## 2023-03-16 MED ORDER — ORAL CARE MOUTH RINSE: 15.0000 mL | Freq: Once | OROMUCOSAL | Status: AC

## 2023-03-16 MED ORDER — CHLORHEXIDINE GLUCONATE 0.12 % MT SOLN
15.0000 mL | Freq: Once | OROMUCOSAL | Status: AC
  Administered 2023-03-16: 15 mL via OROMUCOSAL

## 2023-03-16 MED ORDER — OXYCODONE HCL 5 MG PO TABS: 5.0000 mg | ORAL_TABLET | Freq: Once | ORAL | Status: DC | PRN

## 2023-03-16 MED ORDER — SUCCINYLCHOLINE CHLORIDE 200 MG/10ML IV SOSY
PREFILLED_SYRINGE | INTRAVENOUS | Status: DC | PRN
  Administered 2023-03-16: 120 mg via INTRAVENOUS

## 2023-03-16 MED ORDER — FENTANYL CITRATE PF 50 MCG/ML IJ SOSY: 25.0000 ug | PREFILLED_SYRINGE | INTRAMUSCULAR | Status: DC | PRN

## 2023-03-16 MED ORDER — ONDANSETRON HCL 4 MG/2ML IJ SOLN: 4.0000 mg | Freq: Once | INTRAMUSCULAR | Status: DC | PRN

## 2023-03-16 MED ORDER — ONDANSETRON HCL 4 MG/2ML IJ SOLN
INTRAMUSCULAR | Status: DC | PRN
  Administered 2023-03-16: 4 mg via INTRAVENOUS

## 2023-03-16 MED ORDER — CEFAZOLIN SODIUM-DEXTROSE 2-4 GM/100ML-% IV SOLN
2.0000 g | Freq: Once | INTRAVENOUS | Status: AC
  Administered 2023-03-16: 2 g via INTRAVENOUS

## 2023-03-16 MED ORDER — CEFAZOLIN SODIUM-DEXTROSE 2-4 GM/100ML-% IV SOLN
INTRAVENOUS | Status: AC
  Filled 2023-03-16: qty 100

## 2023-03-16 MED ORDER — ACETAMINOPHEN 500 MG PO TABS
1000.0000 mg | ORAL_TABLET | Freq: Once | ORAL | Status: AC
  Administered 2023-03-16: 1000 mg via ORAL
  Filled 2023-03-16: qty 2

## 2023-03-16 MED ORDER — PROPOFOL 10 MG/ML IV BOLUS
INTRAVENOUS | Status: DC | PRN
  Administered 2023-03-16: 150 mg via INTRAVENOUS

## 2023-03-16 SURGICAL SUPPLY — 14 items
BAG URO CATCHER STRL LF (MISCELLANEOUS) ×1 IMPLANT
CATH URETL OPEN END 6FR 70 (CATHETERS) IMPLANT
CLOTH BEACON ORANGE TIMEOUT ST (SAFETY) ×1 IMPLANT
GLOVE SURG LX STRL 7.5 STRW (GLOVE) ×1 IMPLANT
GOWN STRL REUS W/ TWL XL LVL3 (GOWN DISPOSABLE) ×1 IMPLANT
GOWN STRL REUS W/TWL XL LVL3 (GOWN DISPOSABLE) ×1
GUIDEWIRE STR DUAL SENSOR (WIRE) ×1 IMPLANT
GUIDEWIRE ZIPWRE .038 STRAIGHT (WIRE) IMPLANT
KIT TURNOVER KIT A (KITS) IMPLANT
MANIFOLD NEPTUNE II (INSTRUMENTS) ×1 IMPLANT
PACK CYSTO (CUSTOM PROCEDURE TRAY) ×1 IMPLANT
STENT URET 6FRX24 CONTOUR (STENTS) IMPLANT
TUBING CONNECTING 10 (TUBING) ×1 IMPLANT
TUBING UROLOGY SET (TUBING) IMPLANT

## 2023-03-16 NOTE — H&P (View-Only) (Signed)
  Office Visit Report     03/16/2023   --------------------------------------------------------------------------------   Tiffany Weeks  MRN: [id number]  DOB: 09/18/1951, 72 year old Female  SSN:    PRIMARY CARE:  Ryan Welborn, DO  PRIMARY CARE FAX:  [phone]  REFERRING:  Jennaya Davis, NP  PROVIDER:  John Wrenn, M.D.  TREATING:  Abhijay Morriss, M.D.  LOCATION:  [facility] - [phone]     --------------------------------------------------------------------------------   CC/HPI: Left flank pain and urolithiasis   Tiffany Weeks is a 72-year-old female who recently presented with unrelenting pain and a ureteral calculus requiring right ureteral stent placement on 5/14. She also is known to have a large left staghorn calculus. She is scheduled for surgery with Dr. Wrenn on 6/14. She presents today after developing the acute onset of severe left-sided flank pain yesterday evening which has persisted through this morning. She describes this as a 10 out of 10. This is associated with nausea. She denies any fever.     ALLERGIES: Align Amoxicillin Augmentin Cipro - Nausea Losartan-Hydrochlorothiazide Naproxen Surgical TAPE Zenpepp    MEDICATIONS: Metformin Hcl 500 mg tablet  Metoprolol Tartrate 50 mg tablet  Benicar 40 mg tablet  Glimepiride 1 mg tablet  Iron  Norvasc 5 mg tablet  Pantoprazole Sodium 40 mg tablet, delayed release  Systane  Vitamin B12  Vitamin D2     GU PSH: No GU PSH    NON-GU PSH: Cataract surgery Cholecystectomy (open) - 1999     GU PMH: Renal calculus - 03/09/2023, - 02/28/2023, - 02/27/2023, - 09/04/2022, - 03/01/2022, - 09/05/2021, She has a small LUP stone. , - 08/22/2021 Ureteral calculus - 03/09/2023, - 02/28/2023, - 02/27/2023, - 09/04/2022, - 03/01/2022, - 09/05/2021, She still has nausea and is a little tender on exam. The stone may not have passed. I will refill the tamsulosin and give her phenergan po since the zofran has not  been effective. She will return in 1-2 weeks and if she remains symptomatic, she will need a CT to further assess the stone. , - 08/22/2021 Ureteral obstruction secondary to calculous - 02/27/2023 Acute Cystitis/UTI - 03/01/2022    NON-GU PMH: Nausea - 08/22/2021 Arthritis Asthma Cardiac murmur, unspecified Diabetes Type 2 GERD Glaucoma Hypercholesterolemia Hypertension    FAMILY HISTORY: Breast Cancer - Sister Death In The Family Father - Other Death In The Family Mother - Other Diabetes - Mother heart - Other High Blood Pressure - Mother   SOCIAL HISTORY: Marital Status: Single Preferred Language: English; Ethnicity: Not Hispanic Or Latino; Race: White Current Smoking Status: Patient has never smoked.   Tobacco Use Assessment Completed: Used Tobacco in last 30 days? Does not use smokeless tobacco. Has never drank.  Does not use drugs. Drinks 1 caffeinated drink per day.    REVIEW OF SYSTEMS:    GU Review Female:   Patient denies currently pregnant, get up at night to urinate, burning /pain with urination, trouble starting your stream, hard to postpone urination, stream starts and stops, frequent urination, have to strain to urinate, and leakage of urine.  Gastrointestinal (Upper):   Patient reports nausea. Patient denies vomiting.  Gastrointestinal (Lower):   Patient denies diarrhea and constipation.  Constitutional:   Patient denies fever, night sweats, weight loss, and fatigue.  Skin:   Patient denies skin rash/ lesion and itching.  Eyes:   Patient denies blurred vision and double vision.  Ears/ Nose/ Throat:   Patient denies sore throat and sinus problems.  Hematologic/Lymphatic:     Patient denies swollen glands and easy bruising.  Cardiovascular:   Patient denies leg swelling and chest pains.  Respiratory:   Patient denies cough and shortness of breath.  Endocrine:   Patient denies excessive thirst.  Musculoskeletal:   Patient denies joint pain.  Neurological:    Patient denies headaches and dizziness.  Psychologic:   Patient denies depression and anxiety.   Notes: Left flank pain with nausea started yesterday 9pm.     VITAL SIGNS:      03/16/2023 11:58 AM  BP 149/83 mmHg  Pulse 97 /min  Temperature 97.5 F / 36.3 C   MULTI-SYSTEM PHYSICAL EXAMINATION:    Constitutional: Well-nourished. No physical deformities. Normally developed. Good grooming.  Respiratory: No labored breathing, no use of accessory muscles.   Cardiovascular: Normal temperature, normal extremity pulses, no swelling, no varicosities.  Gastrointestinal: Moderate left CVA tenderness.     Complexity of Data:  X-Ray Review: KUB: Reviewed Films.    Notes:                     I independently reviewed her KUB x-ray. This demonstrates her right ureteral stent to remain in appropriate position. Her large left renal calculus remains in the renal pelvis but also slightly more toward the UPJ likely indicating the source for her left-sided pain today.   PROCEDURES:         KUB - 74018  A single view of the abdomen is obtained.      Patient confirmed No Neulasta OnPro Device.           Urinalysis w/Scope - 81001 Dipstick Dipstick Cont'd Micro  Color: Red Bilirubin: Neg WBC/hpf: 6 - 10/hpf  Appearance: Cloudy Ketones: Trace RBC/hpf: >60/hpf  Specific Gravity: 1.025 Blood: 3+ Bacteria: Rare (0-9/hpf)  pH: <=5.0 Protein: 3+ Cystals: NS (Not Seen)  Glucose: Neg Urobilinogen: 0.2 Casts: NS (Not Seen)    Nitrites: Neg Trichomonas: Not Present    Leukocyte Esterase: 2+ Mucous: Not Present      Epithelial Cells: NS (Not Seen)      Yeast: NS (Not Seen)      Sperm: Not Present    Notes:  Micro performed on unspun urine           Ketoralac 60mg - J1885A, [id number] Ketoraiac 60mg given without difficulty. Right buttock.  None wasted.   Qty: 60 Adm. By: Judy Branson  Unit: mg Lot No [id number]  Route: IM Exp. Date 06/10/2023  Freq: None Mfgr.:   Site: Right Buttock   ASSESSMENT:       ICD-10 Details  1 GU:   Renal calculus - N20.0   2   Ureteral calculus - N20.1    PLAN:           Orders X-Rays: KUB          Schedule Return Visit/Planned Activity: Keep Scheduled Appointment          Document Letter(s):  Created for Patient: Clinical Summary   Created for Patient: Clinical Summary         Notes:   1. Right ureteral calculus: Her stent remains in appropriate position. She will keep her planned surgery on 6/14 with Dr. Wrenn for treatment.   2. Left renal calculus/UPJ calculus: She remains in pain despite Toradol administration. I did discuss the option of continued attempts at pain control versus the option of left ureteral stent placement. Ultimately, she opts for ureteral stent placement today for pain control pending her definitive procedure   on 6/14. The potential risks, complications, and expected recovery process were discussed in detail. Informed consent was obtained.   CC: Dr. Ryan Welborn  Dr. John Wrenn        Next Appointment:      Next Appointment: 03/30/2023 10:00 AM    Appointment Type: Surgery     Location: [facility] - [id number]    Provider: John Wrenn, M.D.    Reason for Visit: OP NE CYSTO RT URS HLL STENT EXCHANGE LT RTG STENT POSS URS HLL          

## 2023-03-16 NOTE — Anesthesia Procedure Notes (Signed)
Procedure Name: Intubation Date/Time: 03/16/2023 2:41 PM  Performed by: Shanon Payor, CRNAPre-anesthesia Checklist: Patient identified, Emergency Drugs available, Suction available, Patient being monitored and Timeout performed Patient Re-evaluated:Patient Re-evaluated prior to induction Oxygen Delivery Method: Circle system utilized Preoxygenation: Pre-oxygenation with 100% oxygen Induction Type: IV induction, Rapid sequence and Cricoid Pressure applied Laryngoscope Size: Mac, Glidescope and 3 Grade View: Grade I Tube type: Oral Tube size: 7.0 mm Number of attempts: 1 Airway Equipment and Method: Rigid stylet and Video-laryngoscopy Placement Confirmation: ETT inserted through vocal cords under direct vision, positive ETCO2, CO2 detector and breath sounds checked- equal and bilateral Secured at: 23 cm Tube secured with: Tape Dental Injury: Teeth and Oropharynx as per pre-operative assessment

## 2023-03-16 NOTE — Anesthesia Postprocedure Evaluation (Signed)
Anesthesia Post Note  Patient: Tiffany Weeks  Procedure(s) Performed: CYSTOSCOPY WITH LEFT STENT PLACEMENT (Left)     Patient location during evaluation: PACU Anesthesia Type: General Level of consciousness: awake and alert Pain management: pain level controlled Vital Signs Assessment: post-procedure vital signs reviewed and stable Respiratory status: spontaneous breathing, nonlabored ventilation and respiratory function stable Cardiovascular status: stable and blood pressure returned to baseline Anesthetic complications: no   No notable events documented.  Last Vitals:  Vitals:   03/16/23 1552 03/16/23 1553  BP: (!) 152/74   Pulse: 89 85  Resp:  14  Temp:  (!) 36.3 C  SpO2: 99% 99%    Last Pain:  Vitals:   03/16/23 1553  TempSrc: Temporal  PainSc: 0-No pain                 Beryle Lathe

## 2023-03-16 NOTE — Anesthesia Preprocedure Evaluation (Addendum)
Anesthesia Evaluation  Patient identified by MRN, date of birth, ID band Patient awake    Reviewed: Allergy & Precautions, NPO status , Patient's Chart, lab work & pertinent test results, reviewed documented beta blocker date and time   History of Anesthesia Complications Negative for: history of anesthetic complications  Airway Mallampati: II  TM Distance: >3 FB Neck ROM: Full    Dental  (+) Dental Advisory Given, Teeth Intact   Pulmonary asthma    Pulmonary exam normal        Cardiovascular hypertension, Pt. on medications and Pt. on home beta blockers Normal cardiovascular exam+ dysrhythmias Supra Ventricular Tachycardia      Neuro/Psych negative neurological ROS  negative psych ROS   GI/Hepatic negative GI ROS, Neg liver ROS,,,  Endo/Other  diabetes, Type 2, Oral Hypoglycemic Agents    Renal/GU CRFRenal disease     Musculoskeletal  (+) Arthritis , Osteoarthritis,    Abdominal   Peds  Hematology negative hematology ROS (+)   Anesthesia Other Findings On GLP-1a   Reproductive/Obstetrics                              Anesthesia Physical Anesthesia Plan  ASA: 3  Anesthesia Plan: General   Post-op Pain Management: Tylenol PO (pre-op)*   Induction: Intravenous  PONV Risk Score and Plan: 3 and Treatment may vary due to age or medical condition, Ondansetron and Dexamethasone  Airway Management Planned: Oral ETT  Additional Equipment: None  Intra-op Plan:   Post-operative Plan: Extubation in OR  Informed Consent: I have reviewed the patients History and Physical, chart, labs and discussed the procedure including the risks, benefits and alternatives for the proposed anesthesia with the patient or authorized representative who has indicated his/her understanding and acceptance.     Dental advisory given  Plan Discussed with: CRNA and Anesthesiologist  Anesthesia Plan  Comments: ( )         Anesthesia Quick Evaluation

## 2023-03-16 NOTE — Transfer of Care (Signed)
Immediate Anesthesia Transfer of Care Note  Patient: Tiffany Weeks  Procedure(s) Performed: CYSTOSCOPY WITH LEFT STENT PLACEMENT (Left)  Patient Location: PACU  Anesthesia Type:General  Level of Consciousness: awake, alert , oriented, and patient cooperative  Airway & Oxygen Therapy: Patient Spontanous Breathing and Patient connected to nasal cannula oxygen  Post-op Assessment: Report given to RN, Post -op Vital signs reviewed and stable, and Patient moving all extremities X 4  Post vital signs: Reviewed and stable  Last Vitals:  Vitals Value Taken Time  BP 148/68 03/16/23 1516  Temp    Pulse 94 03/16/23 1518  Resp 18 03/16/23 1518  SpO2 99 % 03/16/23 1518  Vitals shown include unvalidated device data.  Last Pain:  Vitals:   03/16/23 1359  TempSrc:   PainSc: 0-No pain         Complications: No notable events documented.

## 2023-03-16 NOTE — Op Note (Signed)
Preoperative diagnosis:  Left UPJ calculus   Postoperative diagnosis:  Left UPJ calculus   Procedure:  Cystoscopy Left ureteral stent placement (6 x 24 - no string)   Surgeon: Rolly Salter, Montez Hageman. M.D.  Anesthesia: General  Complications: None  EBL: Minimal  Specimens: None  Indication: Tiffany Weeks is a 71 y.o. patient with a left UPJ calculus.  She presented today with worsening pain.  She has a right ureteral stent s/p stent placement on 5/15 for a right ureteral stone. After reviewing the management options for treatment, he elected to proceed with the above surgical procedure(s). We have discussed the potential benefits and risks of the procedure, side effects of the proposed treatment, the likelihood of the patient achieving the goals of the procedure, and any potential problems that might occur during the procedure or recuperation. Informed consent has been obtained.  Description of procedure:  The patient was taken to the operating room and general anesthesia was induced.  The patient was placed in the dorsal lithotomy position, prepped and draped in the usual sterile fashion, and preoperative antibiotics were administered. A preoperative time-out was performed.   Cystourethroscopy was performed.  The patient's urethra was examined and was normal. The bladder was then systematically examined in its entirety. There was no evidence for any bladder tumors, stones, or other mucosal pathology.    A 0.38 sensor guidewire was then advanced up the left ureter into the renal pelvis under fluoroscopic guidance.  The wire was then backloaded through the cystoscope and a ureteral stent was advance over the wire using Seldinger technique.  The stent was positioned appropriately under fluoroscopic and cystoscopic guidance.  The wire was then removed with an adequate stent curl noted in the renal pelvis as well as in the bladder.  The bladder was then emptied and the procedure ended.  The  patient appeared to tolerate the procedure well and without complications.  The patient was able to be awakened and transferred to the recovery unit in satisfactory condition.    Moody Bruins MD

## 2023-03-16 NOTE — Discharge Instructions (Addendum)

## 2023-03-16 NOTE — H&P (Signed)
Office Visit Report     03/16/2023   --------------------------------------------------------------------------------   Tiffany Weeks  MRN: [id number]  DOB: 03/08/1951, 72 year old Female  SSN:    PRIMARY CARE:  Tiffany Poling, DO  PRIMARY CARE FAX:  [phone]  REFERRING:  Tiffany Crews, NP  PROVIDER:  Bjorn Weeks, M.D.  TREATING:  Tiffany Weeks, M.D.  LOCATION:  [facility] [phone]     --------------------------------------------------------------------------------   CC/HPI: Left flank pain and urolithiasis   Tiffany Weeks is a 72 year old female who recently presented with unrelenting pain and a ureteral calculus requiring right ureteral stent placement on 5/14. She also is known to have a large left staghorn calculus. She is scheduled for surgery with Dr. Annabell Weeks on 6/14. She presents today after developing the acute onset of severe left-sided flank pain yesterday evening which has persisted through this morning. She describes this as a 10 out of 10. This is associated with nausea. She denies any fever.     ALLERGIES: Align Amoxicillin Augmentin Cipro - Nausea Losartan-Hydrochlorothiazide Naproxen Surgical TAPE Zenpepp    MEDICATIONS: Metformin Hcl 500 mg tablet  Metoprolol Tartrate 50 mg tablet  Benicar 40 mg tablet  Glimepiride 1 mg tablet  Iron  Norvasc 5 mg tablet  Pantoprazole Sodium 40 mg tablet, delayed release  Systane  Vitamin B12  Vitamin D2     GU PSH: No GU PSH    NON-GU PSH: Cataract surgery Cholecystectomy (open) - 1999     GU PMH: Renal calculus - 03/09/2023, - 02/28/2023, - 02/27/2023, - 09/04/2022, - 03/01/2022, - 09/05/2021, She has a small LUP stone. , - 08/22/2021 Ureteral calculus - 03/09/2023, - 02/28/2023, - 02/27/2023, - 09/04/2022, - 03/01/2022, - 09/05/2021, She still has nausea and is a little tender on exam. The stone may not have passed. I will refill the tamsulosin and give her phenergan po since the zofran has not  been effective. She will return in 1-2 weeks and if she remains symptomatic, she will need a CT to further assess the stone. , - 08/22/2021 Ureteral obstruction secondary to calculous - 02/27/2023 Acute Cystitis/UTI - 03/01/2022    NON-GU PMH: Nausea - 08/22/2021 Arthritis Asthma Cardiac murmur, unspecified Diabetes Type 2 GERD Glaucoma Hypercholesterolemia Hypertension    FAMILY HISTORY: Breast Cancer - Sister Death In The Family Father - Other Death In The Family Mother - Other Diabetes - Mother heart - Other High Blood Pressure - Mother   SOCIAL HISTORY: Marital Status: Single Preferred Language: English; Ethnicity: Not Hispanic Or Latino; Race: White Current Smoking Status: Patient has never smoked.   Tobacco Use Assessment Completed: Used Tobacco in last 30 days? Does not use smokeless tobacco. Has never drank.  Does not use drugs. Drinks 1 caffeinated drink per day.    REVIEW OF SYSTEMS:    GU Review Female:   Patient denies currently pregnant, get up at night to urinate, burning /pain with urination, trouble starting your stream, hard to postpone urination, stream starts and stops, frequent urination, have to strain to urinate, and leakage of urine.  Gastrointestinal (Upper):   Patient reports nausea. Patient denies vomiting.  Gastrointestinal (Lower):   Patient denies diarrhea and constipation.  Constitutional:   Patient denies fever, night sweats, weight loss, and fatigue.  Skin:   Patient denies skin rash/ lesion and itching.  Eyes:   Patient denies blurred vision and double vision.  Ears/ Nose/ Throat:   Patient denies sore throat and sinus problems.  Hematologic/Lymphatic:  Patient denies swollen glands and easy bruising.  Cardiovascular:   Patient denies leg swelling and chest pains.  Respiratory:   Patient denies cough and shortness of breath.  Endocrine:   Patient denies excessive thirst.  Musculoskeletal:   Patient denies joint pain.  Neurological:    Patient denies headaches and dizziness.  Psychologic:   Patient denies depression and anxiety.   Notes: Left flank pain with nausea started yesterday 9pm.     VITAL SIGNS:      03/16/2023 11:58 AM  BP 149/83 mmHg  Pulse 97 /min  Temperature 97.5 F / 36.3 C   MULTI-SYSTEM PHYSICAL EXAMINATION:    Constitutional: Well-nourished. No physical deformities. Normally developed. Good grooming.  Respiratory: No labored breathing, no use of accessory muscles.   Cardiovascular: Normal temperature, normal extremity pulses, no swelling, no varicosities.  Gastrointestinal: Moderate left CVA tenderness.     Complexity of Data:  X-Ray Review: KUB: Reviewed Films.    Notes:                     I independently reviewed her KUB x-ray. This demonstrates her right ureteral stent to remain in appropriate position. Her large left renal calculus remains in the renal pelvis but also slightly more toward the UPJ likely indicating the source for her left-sided pain today.   PROCEDURES:         KUB - [id number]  A single view of the abdomen is obtained.      Patient confirmed No Neulasta OnPro Device.           Urinalysis w/Scope - 81001 Dipstick Dipstick Cont'd Micro  Color: Red Bilirubin: Neg WBC/hpf: 6 - 10/hpf  Appearance: Cloudy Ketones: Trace RBC/hpf: >60/hpf  Specific Gravity: 1.025 Blood: 3+ Bacteria: Rare (0-9/hpf)  pH: <=5.0 Protein: 3+ Cystals: NS (Not Seen)  Glucose: Neg Urobilinogen: 0.2 Casts: NS (Not Seen)    Nitrites: Neg Trichomonas: Not Present    Leukocyte Esterase: 2+ Mucous: Not Present      Epithelial Cells: NS (Not Seen)      Yeast: NS (Not Seen)      Sperm: Not Present    Notes:  Micro performed on unspun urine           Ketoralac 60mg  - [id number], [id number] Ketoraiac 60mg  given without difficulty. Right buttock.  None wasted.   Qty: 60 Adm. By: Tiffany Weeks  Unit: mg Lot No [id number]  Route: IM Exp. Date 06/10/2023  Freq: None Mfgr.:   Site: Right Buttock   ASSESSMENT:       ICD-10 Details  1 GU:   Renal calculus - N20.0   2   Ureteral calculus - N20.1    PLAN:           Orders X-Rays: KUB          Schedule Return Visit/Planned Activity: Keep Scheduled Appointment          Document Letter(s):  Created for Patient: Clinical Summary   Created for Patient: Clinical Summary         Notes:   1. Right ureteral calculus: Her stent remains in appropriate position. She will keep her planned surgery on 6/14 with Dr. Annabell Weeks for treatment.   2. Left renal calculus/UPJ calculus: She remains in pain despite Toradol administration. I did discuss the option of continued attempts at pain control versus the option of left ureteral stent placement. Ultimately, she opts for ureteral stent placement today for pain control pending her definitive procedure  on 6/14. The potential risks, complications, and expected recovery process were discussed in detail. Informed consent was obtained.   CC: Dr. Jackelyn Weeks  Dr. Bjorn Weeks        Next Appointment:      Next Appointment: 03/30/2023 10:00 AM    Appointment Type: Surgery     Location: [facility] [phone]    Provider: Bjorn Weeks, M.D.    Reason for Visit: OP NE CYSTO RT URS HLL STENT EXCHANGE LT RTG STENT POSS URS HLL

## 2023-03-17 ENCOUNTER — Encounter (HOSPITAL_COMMUNITY): Payer: Self-pay | Admitting: Urology

## 2023-03-19 DIAGNOSIS — N1831 Chronic kidney disease, stage 3a: Secondary | ICD-10-CM | POA: Diagnosis not present

## 2023-03-19 DIAGNOSIS — E1165 Type 2 diabetes mellitus with hyperglycemia: Secondary | ICD-10-CM | POA: Diagnosis not present

## 2023-03-19 DIAGNOSIS — I10 Essential (primary) hypertension: Secondary | ICD-10-CM | POA: Diagnosis not present

## 2023-03-19 DIAGNOSIS — E785 Hyperlipidemia, unspecified: Secondary | ICD-10-CM | POA: Diagnosis not present

## 2023-03-21 DIAGNOSIS — N200 Calculus of kidney: Secondary | ICD-10-CM | POA: Diagnosis not present

## 2023-03-21 DIAGNOSIS — R8271 Bacteriuria: Secondary | ICD-10-CM | POA: Diagnosis not present

## 2023-03-26 ENCOUNTER — Encounter (HOSPITAL_BASED_OUTPATIENT_CLINIC_OR_DEPARTMENT_OTHER): Payer: Self-pay | Admitting: Urology

## 2023-03-26 NOTE — Progress Notes (Signed)
Spoke w/ via phone for pre-op interview--- pt Lab needs dos----  istat, EKG             Lab results------ no COVID test -----patient states asymptomatic no test needed Arrive at ------- 0800 on 03-30-2023 NPO after MN NO Solid Food.  Clear liquids from MN until--- 0700 Med rec completed Medications to take morning of surgery ----- lopressor, norvasc, protonix Diabetic medication ----- do not take metformin, amaryl, rybelsus morning of surgery Pt last dose rybelsus day before am. Patient instructed no nail polish to be worn day of surgery Patient instructed to bring photo id and insurance card day of surgery Patient aware to have Driver (ride ) / caregiver    for 24 hours after surgery --- friend, linda Patient Special Instructions ----- n/a Pre-Op special Instructions -----  n/a Patient verbalized understanding of instructions that were given at this phone interview. Patient denies shortness of breath, chest pain, fever, cough at this phone interview.  Anesthesia Review:  HTN;  NSVT;  DM 2;  CKD 3  PCP:  Dr Modesto Charon Cardiologist :  Dr Eden Emms (lov 05-03-2022) EKG : 01-19-2022 Echo : 12-23-2021 Stress test: nuclear 02-09-2016 Monitor:  12-26-2021 Cardiac Cath :  no Activity level: denies sob w/ any activity Sleep Study/ CPAP : no Fasting Blood Sugar : 113--115     / Checks Blood Sugar -- times a day:  daily am  Blood Thinner/ Instructions /Last Dose: no ASA / Instructions/ Last Dose : per pt given instructions by dr Annabell Howells office to stop prior to surgery , last dose 03-23-2023

## 2023-03-30 ENCOUNTER — Encounter (HOSPITAL_BASED_OUTPATIENT_CLINIC_OR_DEPARTMENT_OTHER)
Admission: RE | Disposition: A | Discharge status: Home / Self Care | Payer: Self-pay | Source: Home / Self Care | Attending: Urology

## 2023-03-30 ENCOUNTER — Encounter (HOSPITAL_BASED_OUTPATIENT_CLINIC_OR_DEPARTMENT_OTHER): Payer: Self-pay | Admitting: Urology

## 2023-03-30 ENCOUNTER — Ambulatory Visit (HOSPITAL_BASED_OUTPATIENT_CLINIC_OR_DEPARTMENT_OTHER)
Admission: RE | Admit: 2023-03-30 | Discharge: 2023-03-30 | Disposition: A | Discharge status: Home / Self Care | Payer: Medicare Other | Attending: Urology | Admitting: Urology

## 2023-03-30 ENCOUNTER — Other Ambulatory Visit: Payer: Self-pay

## 2023-03-30 ENCOUNTER — Ambulatory Visit (HOSPITAL_BASED_OUTPATIENT_CLINIC_OR_DEPARTMENT_OTHER): Payer: Medicare Other | Admitting: Anesthesiology

## 2023-03-30 DIAGNOSIS — N202 Calculus of kidney with calculus of ureter: Secondary | ICD-10-CM | POA: Insufficient documentation

## 2023-03-30 DIAGNOSIS — E1122 Type 2 diabetes mellitus with diabetic chronic kidney disease: Secondary | ICD-10-CM

## 2023-03-30 DIAGNOSIS — Z01818 Encounter for other preprocedural examination: Secondary | ICD-10-CM

## 2023-03-30 DIAGNOSIS — I129 Hypertensive chronic kidney disease with stage 1 through stage 4 chronic kidney disease, or unspecified chronic kidney disease: Secondary | ICD-10-CM | POA: Diagnosis not present

## 2023-03-30 DIAGNOSIS — I10 Essential (primary) hypertension: Secondary | ICD-10-CM | POA: Insufficient documentation

## 2023-03-30 DIAGNOSIS — N189 Chronic kidney disease, unspecified: Secondary | ICD-10-CM | POA: Diagnosis not present

## 2023-03-30 DIAGNOSIS — E119 Type 2 diabetes mellitus without complications: Secondary | ICD-10-CM | POA: Diagnosis not present

## 2023-03-30 DIAGNOSIS — Z7984 Long term (current) use of oral hypoglycemic drugs: Secondary | ICD-10-CM | POA: Insufficient documentation

## 2023-03-30 DIAGNOSIS — Z79899 Other long term (current) drug therapy: Secondary | ICD-10-CM | POA: Diagnosis not present

## 2023-03-30 HISTORY — DX: Calculus of ureter: N20.1

## 2023-03-30 HISTORY — DX: Presence of spectacles and contact lenses: Z97.3

## 2023-03-30 HISTORY — DX: Chronic kidney disease, stage 3 unspecified: N18.30

## 2023-03-30 HISTORY — DX: Unspecified osteoarthritis, unspecified site: M19.90

## 2023-03-30 HISTORY — DX: Palpitations: R00.2

## 2023-03-30 HISTORY — DX: Personal history of urinary calculi: Z87.442

## 2023-03-30 HISTORY — DX: Supraventricular tachycardia, unspecified: I47.10

## 2023-03-30 HISTORY — PX: CYSTOSCOPY/URETEROSCOPY/HOLMIUM LASER/STENT PLACEMENT: SHX6546

## 2023-03-30 HISTORY — DX: Type 2 diabetes mellitus without complications: E11.9

## 2023-03-30 HISTORY — DX: Personal history of other diseases of the respiratory system: Z87.09

## 2023-03-30 HISTORY — DX: Iron deficiency anemia, unspecified: D50.9

## 2023-03-30 HISTORY — DX: Calculus of kidney: N20.0

## 2023-03-30 LAB — POCT I-STAT, CHEM 8
BUN: 27 mg/dL — ABNORMAL HIGH (ref 8–23)
Calcium, Ion: 1.24 mmol/L (ref 1.15–1.40)
Chloride: 108 mmol/L (ref 98–111)
Creatinine, Ser: 2.2 mg/dL — ABNORMAL HIGH (ref 0.44–1.00)
Glucose, Bld: 149 mg/dL — ABNORMAL HIGH (ref 70–99)
HCT: 29 % — ABNORMAL LOW (ref 36.0–46.0)
Hemoglobin: 9.9 g/dL — ABNORMAL LOW (ref 12.0–15.0)
Potassium: 3.7 mmol/L (ref 3.5–5.1)
Sodium: 142 mmol/L (ref 135–145)
TCO2: 22 mmol/L (ref 22–32)

## 2023-03-30 LAB — GLUCOSE, CAPILLARY: Glucose-Capillary: 127 mg/dL — ABNORMAL HIGH (ref 70–99)

## 2023-03-30 SURGERY — CYSTOSCOPY/URETEROSCOPY/HOLMIUM LASER/STENT PLACEMENT
Anesthesia: General | Site: Renal | Laterality: Bilateral

## 2023-03-30 MED ORDER — GENTAMICIN SULFATE 40 MG/ML IJ SOLN
5.0000 mg/kg | INTRAVENOUS | Status: AC
  Administered 2023-03-30: 310 mg via INTRAVENOUS
  Filled 2023-03-30: qty 7.75

## 2023-03-30 MED ORDER — MIDAZOLAM HCL 2 MG/2ML IJ SOLN
INTRAMUSCULAR | Status: AC
  Filled 2023-03-30: qty 2

## 2023-03-30 MED ORDER — ACETAMINOPHEN 10 MG/ML IV SOLN: 1000.0000 mg | Freq: Once | INTRAVENOUS | Status: DC | PRN

## 2023-03-30 MED ORDER — LIDOCAINE 2% (20 MG/ML) 5 ML SYRINGE
INTRAMUSCULAR | Status: DC | PRN
  Administered 2023-03-30: 50 mg via INTRAVENOUS

## 2023-03-30 MED ORDER — FENTANYL CITRATE (PF) 100 MCG/2ML IJ SOLN
INTRAMUSCULAR | Status: AC
  Filled 2023-03-30: qty 2

## 2023-03-30 MED ORDER — SODIUM CHLORIDE 0.9% FLUSH: 3.0000 mL | Freq: Two times a day (BID) | INTRAVENOUS | Status: DC

## 2023-03-30 MED ORDER — ACETAMINOPHEN 160 MG/5ML PO SOLN: 1000.0000 mg | Freq: Once | ORAL | Status: DC | PRN

## 2023-03-30 MED ORDER — IOHEXOL 300 MG/ML  SOLN
INTRAMUSCULAR | Status: DC | PRN
  Administered 2023-03-30: 10 mL

## 2023-03-30 MED ORDER — ONDANSETRON HCL 4 MG/2ML IJ SOLN
INTRAMUSCULAR | Status: DC | PRN
  Administered 2023-03-30: 4 mg via INTRAVENOUS

## 2023-03-30 MED ORDER — PROPOFOL 10 MG/ML IV BOLUS
INTRAVENOUS | Status: DC | PRN
  Administered 2023-03-30: 100 mg via INTRAVENOUS

## 2023-03-30 MED ORDER — PROPOFOL 10 MG/ML IV BOLUS
INTRAVENOUS | Status: AC
  Filled 2023-03-30: qty 20

## 2023-03-30 MED ORDER — FENTANYL CITRATE (PF) 250 MCG/5ML IJ SOLN
INTRAMUSCULAR | Status: DC | PRN
  Administered 2023-03-30: 25 ug via INTRAVENOUS
  Administered 2023-03-30: 50 ug via INTRAVENOUS
  Administered 2023-03-30 (×5): 25 ug via INTRAVENOUS

## 2023-03-30 MED ORDER — SODIUM CHLORIDE 0.9 % IV SOLN: INTRAVENOUS | Status: DC

## 2023-03-30 MED ORDER — FENTANYL CITRATE (PF) 100 MCG/2ML IJ SOLN: 25.0000 ug | INTRAMUSCULAR | Status: DC | PRN

## 2023-03-30 MED ORDER — SULFAMETHOXAZOLE-TRIMETHOPRIM 800-160 MG PO TABS: 1.0000 | ORAL_TABLET | Freq: Two times a day (BID) | ORAL | 0 refills | Status: DC

## 2023-03-30 MED ORDER — DEXAMETHASONE SODIUM PHOSPHATE 10 MG/ML IJ SOLN
INTRAMUSCULAR | Status: DC | PRN
  Administered 2023-03-30: 5 mg via INTRAVENOUS

## 2023-03-30 MED ORDER — MIDAZOLAM HCL 2 MG/2ML IJ SOLN
INTRAMUSCULAR | Status: DC | PRN
  Administered 2023-03-30: 1 mg via INTRAVENOUS

## 2023-03-30 MED ORDER — ACETAMINOPHEN 500 MG PO TABS: 1000.0000 mg | ORAL_TABLET | Freq: Once | ORAL | Status: DC | PRN

## 2023-03-30 MED ORDER — PHENYLEPHRINE 80 MCG/ML (10ML) SYRINGE FOR IV PUSH (FOR BLOOD PRESSURE SUPPORT)
PREFILLED_SYRINGE | INTRAVENOUS | Status: DC | PRN
  Administered 2023-03-30 (×4): 80 ug via INTRAVENOUS

## 2023-03-30 MED ORDER — SODIUM CHLORIDE 0.9 % IR SOLN
Status: DC | PRN
  Administered 2023-03-30: 3000 mL

## 2023-03-30 SURGICAL SUPPLY — 30 items
BAG DRAIN URO-CYSTO SKYTR STRL (DRAIN) ×1 IMPLANT
BAG DRN UROCATH (DRAIN) ×1
BASKET STONE 1.7 NGAGE (UROLOGICAL SUPPLIES) IMPLANT
BASKET ZERO TIP NITINOL 2.4FR (BASKET) IMPLANT
BSKT STON RTRVL ZERO TP 2.4FR (BASKET)
CATH URET 5FR 28IN CONE TIP (BALLOONS)
CATH URET 5FR 70CM CONE TIP (BALLOONS) IMPLANT
CATH URETL OPEN 5X70 (CATHETERS) IMPLANT
CLOTH BEACON ORANGE TIMEOUT ST (SAFETY) ×1 IMPLANT
COVER DOME SNAP 22 D (MISCELLANEOUS) IMPLANT
ELECT REM PT RETURN 9FT ADLT (ELECTROSURGICAL)
ELECTRODE REM PT RTRN 9FT ADLT (ELECTROSURGICAL) IMPLANT
GLOVE SURG SS PI 8.0 STRL IVOR (GLOVE) ×1 IMPLANT
GOWN STRL REUS W/TWL XL LVL3 (GOWN DISPOSABLE) ×1 IMPLANT
GUIDEWIRE ANG ZIPWIRE 038X150 (WIRE) IMPLANT
GUIDEWIRE STR DUAL SENSOR (WIRE) ×1 IMPLANT
INFUSOR MANOMETER BAG 3000ML (MISCELLANEOUS) IMPLANT
IV NS IRRIG 3000ML ARTHROMATIC (IV SOLUTION) ×1 IMPLANT
KIT TURNOVER CYSTO (KITS) ×1 IMPLANT
LASER FIB FLEXIVA PULSE ID 365 (Laser) IMPLANT
MANIFOLD NEPTUNE II (INSTRUMENTS) ×1 IMPLANT
NS IRRIG 500ML POUR BTL (IV SOLUTION) ×1 IMPLANT
PACK CYSTO (CUSTOM PROCEDURE TRAY) ×1 IMPLANT
SHEATH NAVIGATOR HD 11/13X36 (SHEATH) IMPLANT
SLEEVE SCD COMPRESS KNEE MED (STOCKING) ×1 IMPLANT
STENT URET 6FRX24 CONTOUR (STENTS) IMPLANT
TRACTIP FLEXIVA PULS ID 200XHI (Laser) IMPLANT
TRACTIP FLEXIVA PULSE ID 200 (Laser) ×1
TUBE CONNECTING 12X1/4 (SUCTIONS) ×1 IMPLANT
TUBING UROLOGY SET (TUBING) IMPLANT

## 2023-03-30 NOTE — Interval H&P Note (Signed)
History and Physical Interval Note:  03/30/2023 8:27 AM  Tiffany Weeks  has presented today for surgery, with the diagnosis of RIGHT UETERAL STONE LEFT PARTIAL STAGHORN STONE.  The various methods of treatment have been discussed with the patient and family. After consideration of risks, benefits and other options for treatment, the patient has consented to  Procedure(s): CYSTOSCOPY   RIGHT URETEROSCOPY/HOLMIUM LASER/ RIGHT STENT  EXCHANGE  LEFT RETROGRADE LEFT STENT POSSSIBLE LEFT URETEROSCOPY WITH LASER (Bilateral) as a surgical intervention.  The patient's history has been reviewed, patient examined, no change in status, stable for surgery.  I have reviewed the patient's chart and labs.  Questions were answered to the patient's satisfaction.     Bjorn Pippin

## 2023-03-30 NOTE — Discharge Instructions (Addendum)
I am giving you bactrim for antibiotic coverage for 3 days.  While on this med, the metformin levels could go up so you should watch your sugar level closely and reduce the meformin to daily while on the antibiotic.   I replaced the stents, but they have strings to the outside.  Those will be removed at your follow up visit.   Your urine is very acidic and the stones appear to be most consistent with uric acid.   In order to help reduce the stone formation, I would like to have you get Litholyte.   This is a powder that you mix with water or any other liquid to help elevate the urine pH.    The Litholyte twice daily is what I would like you to get and you can order it on [employer] for about $60 for two months worth.  There is a prescription med, Potassium Citrate that is similar and we could send that if you prefer, but it is a very large tablet and can cause stomach upset and with your kidney disease we would have to watch your potassium closely.    Let me know if you questions.         Post Anesthesia Home Care Instructions  Activity: Get plenty of rest for the remainder of the day. A responsible individual must stay with you for 24 hours following the procedure.  For the next 24 hours, DO NOT: -Drive a car -Advertising copywriter -Drink alcoholic beverages -Take any medication unless instructed by your physician -Make any legal decisions or sign important papers.  Meals: Start with liquid foods such as gelatin or soup. Progress to regular foods as tolerated. Avoid greasy, spicy, heavy foods. If nausea and/or vomiting occur, drink only clear liquids until the nausea and/or vomiting subsides. Call your physician if vomiting continues.  Special Instructions/Symptoms: Your throat may feel dry or sore from the anesthesia or the breathing tube placed in your throat during surgery. If this causes discomfort, gargle with warm salt water. The discomfort should disappear within 24 hours.

## 2023-03-30 NOTE — Anesthesia Procedure Notes (Signed)
Procedure Name: LMA Insertion Date/Time: 03/30/2023 10:22 AM  Performed by: Dairl Ponder, CRNAPre-anesthesia Checklist: Patient identified, Emergency Drugs available, Suction available and Patient being monitored Patient Re-evaluated:Patient Re-evaluated prior to induction Oxygen Delivery Method: Circle System Utilized Preoxygenation: Pre-oxygenation with 100% oxygen Induction Type: IV induction Ventilation: Mask ventilation without difficulty LMA: LMA inserted LMA Size: 4.0 Number of attempts: 1 Airway Equipment and Method: Bite block Placement Confirmation: positive ETCO2 Tube secured with: Tape Dental Injury: Teeth and Oropharynx as per pre-operative assessment

## 2023-03-30 NOTE — Transfer of Care (Signed)
Immediate Anesthesia Transfer of Care Note  Patient: SIRAT PIETRZYK  Procedure(s) Performed: CYSTOSCOPY   RIGHT URETEROSCOPY/HOLMIUM LASER/ RIGHT STENT  EXCHANGE  LEFT RETROGRADE LEFT STENT LEFT URETEROSCOPY WITH LASER (Bilateral: Renal)  Patient Location: PACU  Anesthesia Type:General  Level of Consciousness: awake, alert , and oriented  Airway & Oxygen Therapy: Patient Spontanous Breathing and Patient connected to nasal cannula oxygen  Post-op Assessment: Report given to RN and Post -op Vital signs reviewed and stable  Post vital signs: Reviewed and stable  Last Vitals:  Vitals Value Taken Time  BP 142/67 03/30/23 1156  Temp    Pulse 84 03/30/23 1157  Resp 19 03/30/23 1157  SpO2 100 % 03/30/23 1157  Vitals shown include unvalidated device data.  Last Pain:  Vitals:   03/30/23 0825  TempSrc: Oral  PainSc: 0-No pain      Patients Stated Pain Goal: 5 (03/30/23 0825)  Complications: No notable events documented.

## 2023-03-30 NOTE — Op Note (Signed)
Procedure: 1.  Cystoscopy with bilateral ureteroscopy, holmium laser application, stone extraction and stent exchange. 2.  Cystoscopy with ureteroscopy for removal of encrusted left ureteral stent. 3.  Application of fluoroscopy.  Preop diagnosis: Right proximal ureteral stone and left renal partial staghorn stone.  Postop diagnosis: Same with encrusted retained right ureteral stent.  Surgeon: Dr. Bjorn Pippin  Anesthesia: General.  Drains: Bilateral 6 French by 24 cm contour double-J stent with tether's.  Specimen: Stone fragments.  EBL: None.  Complications: None.  Indications: The patient is a 72 year old female who had previously undergone stenting initially on the right side for a proximal stone and subsequently on the left side for a partial staghorn stone with pain and she returns now for ureteroscopic management.  She she was given gentamicin for antibiotic coverage and was taken the operating room where general anesthetic was induced.  She was placed in the lithotomy position and fitted with PAS hose.  She was prepped with Betadine solution and draped in usual sterile fashion.  Cystoscopy was performed using the 21 [place] scope and 30 degree lens.  Examination demonstrated stent loops bilaterally with moderate encrustation.  There was some bladder wall erythema from the stents.  The right stent was grasped with a grasping forceps and pulled the urethral meatus.  An initial attempt to cannulate the stent to place the wire was unsuccessful due to encrustation so the stent was removed.  The cystoscope was then reinserted and a 5 [place] open-ended catheter was used a placement of a sensor wire to the kidney.  A 36 cm 11/13 [place] digital access sheath was then inserted over the wire to the level of the UPJ and the inner core and wire were then removed.  A dual-lumen digital flexible ureteroscope was then passed through the sheath to the kidney and there are visual guidance.  The  intrarenal collecting system was inspected and the stone was noted in the lower calyx consistent with a stone that had been obstructing on CT.  The stone was then fragmented with a 242 m tract of laser fiber and the fragments were removed with an engage basket.  There was a little bit of residual stone material and some debris in the kidney that were felt to be small enough to pass.  Ureteroscope and sheath were removed and the cystoscope was reinserted.  The left stent was then grasped and pulled to the urethral meatus.  Once again the stent was too encrusted to pass the wire and when I attempted to remove the stent it stopped from the encrustation.  I rotated the stent to try to break off the encrustation but that was not successful.  I then passed a 6 French semirigid short ureteroscope alongside the stent and was able to break off the encrustations up to the renal pelvis and the stent was then removable.  I then passed a guidewire through the ureteroscope to the kidney.  After the ureteroscope was removed the 11/13 French 36 cm digital access sheath was passed to the kidney and the inner core and wire removed.  The dual-lumen digital scope was then passed and her stone was identified.  It was then fragmented using the holmium laser with a 242 m fiber with a setting initially on 0.5 J and 10 Hz with a subsequent increase of the frequency to 40 Hz.  The stone was broken into grit and small fragments.  The several of the larger fragments were removed but none were larger than 2 to  3 mm.  Once final inspection demonstrated reduction of the entire stone to fine grit and sand the ureteroscope was removed.  A sensor wire was passed to the kidney through the sheath and the sheath was removed.  The cystoscope was then reinserted over the wire and a 6 [place] by 24 cm contour double-J stent with tether was passed to the kidney under fluoroscopic guidance.  The wire was removed, a good coil in the kidney and a  good coil in the bladder.  I then passed the guidewire to the right kidney and placed a 6 [place] by 24 cm double-J with tether as well and upon removal of the wire a good coil was noted in the kidney and the bladder.  The bladder was drained and the few small stone fragments were evacuated from the bladder during this process.  The cystoscope was removed and the stent strings were left exiting the urethra.  The strings were tied close to the meatus and then trimmed to an appropriate length before being tucked vaginally.  She was taken down from lithotomy position, her anesthetic was reversed and she was moved to recovery in stable condition.  There were no complications.

## 2023-03-30 NOTE — Anesthesia Preprocedure Evaluation (Signed)
Anesthesia Evaluation  Patient identified by MRN, date of birth, ID band Patient awake    Reviewed: Allergy & Precautions, NPO status , Patient's Chart, lab work & pertinent test results, reviewed documented beta blocker date and time   History of Anesthesia Complications Negative for: history of anesthetic complications  Airway Mallampati: II  TM Distance: >3 FB Neck ROM: Full    Dental  (+) Dental Advisory Given, Teeth Intact   Pulmonary asthma    Pulmonary exam normal        Cardiovascular hypertension, Pt. on medications and Pt. on home beta blockers Normal cardiovascular exam+ dysrhythmias Supra Ventricular Tachycardia      Neuro/Psych negative neurological ROS  negative psych ROS   GI/Hepatic negative GI ROS, Neg liver ROS,,,  Endo/Other  diabetes, Type 2, Oral Hypoglycemic Agents    Renal/GU CRFRenal disease     Musculoskeletal  (+) Arthritis , Osteoarthritis,    Abdominal   Peds  Hematology negative hematology ROS (+)   Anesthesia Other Findings On GLP-1a   Reproductive/Obstetrics                             Anesthesia Physical Anesthesia Plan  ASA: 3  Anesthesia Plan: General   Post-op Pain Management:    Induction: Intravenous  PONV Risk Score and Plan: 3 and Treatment may vary due to age or medical condition, Ondansetron, Dexamethasone and Midazolam  Airway Management Planned: LMA  Additional Equipment: None  Intra-op Plan:   Post-operative Plan: Extubation in OR  Informed Consent: I have reviewed the patients History and Physical, chart, labs and discussed the procedure including the risks, benefits and alternatives for the proposed anesthesia with the patient or authorized representative who has indicated his/her understanding and acceptance.     Dental advisory given  Plan Discussed with: CRNA and Anesthesiologist  Anesthesia Plan Comments: ( )         Anesthesia Quick Evaluation

## 2023-04-02 ENCOUNTER — Encounter (HOSPITAL_BASED_OUTPATIENT_CLINIC_OR_DEPARTMENT_OTHER): Payer: Self-pay | Admitting: Urology

## 2023-04-02 NOTE — Anesthesia Postprocedure Evaluation (Signed)
Anesthesia Post Note  Patient: Tiffany Weeks  Procedure(s) Performed: CYSTOSCOPY   RIGHT URETEROSCOPY/HOLMIUM LASER/ RIGHT STENT  EXCHANGE  LEFT RETROGRADE LEFT STENT LEFT URETEROSCOPY WITH LASER (Bilateral: Renal)     Patient location during evaluation: PACU Anesthesia Type: General Level of consciousness: awake and alert Pain management: pain level controlled Vital Signs Assessment: post-procedure vital signs reviewed and stable Respiratory status: spontaneous breathing, nonlabored ventilation and respiratory function stable Cardiovascular status: blood pressure returned to baseline and stable Postop Assessment: no apparent nausea or vomiting Anesthetic complications: no   No notable events documented.  Last Vitals:  Vitals:   03/30/23 1245 03/30/23 1315  BP: (!) 159/78 (!) 149/69  Pulse: 92 85  Resp: 20 20  Temp:  36.4 C  SpO2: 100% 100%    Last Pain:  Vitals:   03/30/23 1315  TempSrc:   PainSc: 0-No pain   Pain Goal: Patients Stated Pain Goal: 4 (03/30/23 1230)                 Analuisa Tudor

## 2023-04-04 DIAGNOSIS — Z6828 Body mass index (BMI) 28.0-28.9, adult: Secondary | ICD-10-CM | POA: Diagnosis not present

## 2023-04-04 DIAGNOSIS — W57XXXA Bitten or stung by nonvenomous insect and other nonvenomous arthropods, initial encounter: Secondary | ICD-10-CM | POA: Diagnosis not present

## 2023-04-04 DIAGNOSIS — S0086XA Insect bite (nonvenomous) of other part of head, initial encounter: Secondary | ICD-10-CM | POA: Diagnosis not present

## 2023-04-06 DIAGNOSIS — N200 Calculus of kidney: Secondary | ICD-10-CM | POA: Diagnosis not present

## 2023-04-19 NOTE — Progress Notes (Signed)
CARDIOLOGY CONSULT NOTE       Patient ID: Tiffany Weeks MRN: [id number] DOB/AGE: March 30, 1951 72 y.o.   Primary Physician: Jackelyn Poling, DO Primary Cardiologist: Eden Emms Reason for Consultation: Tachycardia    HPI:  72 y.o. referred by Dr Modesto Charon for tachycardia. History of DM, HTN, palpitations History of atypical chest pain with normla myovue in 2013 and 2017 Event monitor with no arrhythmias Seen by Dr Modesto Charon 12/05/21 complained of elevated HR since November Atypical chest pain sharp for 2 weeks associated with belching ? GERD Had kidney stone in winter and HR elevated Pulse 102 in office A1c is 6.3 improved BUN/CR ok Hct 33.3 slightly worse LDL 57 Thyroid studies normal T4 8.9 TSH 4.6  Seen in ED 08/16/21 with abdominal / flank pain WBC elevated blood in urine CT with 2 mm proximal left ureteral stone mild obstructive uropathy Was to f/u with [facility]   ECG 05/03/22 SR rate 103 low voltage   Monitor 12/26/21 with average HR 97 bpm Had 107 SVT rund longest 37.7 seconds  at HR 108 bpm with symptoms   Seen by Dr Elberta Fortis 01/19/22 and lopressor increased to 50 mg bid  Has a cat "Smokey" to keep her company Does day trips with [facility]  Primary started her on Norvasc for BP  Been Rx by Dr Annabell Howells for right uretal stone with cystoscopy , laser and stent exchange 03/30/23   Has had some fatigue and exertional dyspnea since dealing with kidney stones EF normal on TTE 12/23/21  ROS All other systems reviewed and negative except as noted above  Past Medical History:  Diagnosis Date   CKD (chronic kidney disease), stage III (HCC)    Heart palpitations    History of asthma    03-26-2023  per  pt hx exercised induced asthma > 30 yrs ago no issue since   History of kidney stones    Hyperlipidemia    IDA (iron deficiency anemia)    Nonsustained supraventricular tachycardia    cardiologist-- dr Eden Emms;   issue since 2013;  last event monitor in epic 12-26-2021 had 107 SVT episodes  the longest run 37 seconds,  normal echo 12-23-2021,  normal nuclear study 02-09-2016   OA (osteoarthritis)    Staghorn renal calculus 02/2023   left partial   Type 2 diabetes mellitus (HCC)    followed by endocrinologist--- Atilano Ina NP ([facility])    (03-26-2023   per p checks blood sugar   Ureteral calculus, right 02/2023   Vitamin D deficiency    Wears glasses     Family History  Problem Relation Age of Onset   Diabetes Mother    Hypertension Mother    Heart failure Mother    Heart disease Mother    Liver disease Father    Liver disease Other    Aortic stenosis Other    Hyperlipidemia Other    Thyroid disease Other    Breast cancer Other    Breast cancer Sister 56    Social History   Socioeconomic History   Marital status: Single    Spouse name: Not on file   Number of children: Not on file   Years of education: Not on file   Highest education level: Not on file  Occupational History   Not on file  Tobacco Use   Smoking status: Never   Smokeless tobacco: Never  Vaping Use   Vaping status: Never Used  Substance and Sexual Activity   Alcohol use:  No   Drug use: Never   Sexual activity: Not on file  Other Topics Concern   Not on file  Social History Narrative   Not on file   Social Determinants of Health   Financial Resource Strain: Not on file  Food Insecurity: Not on file  Transportation Needs: Not on file  Physical Activity: Not on file  Stress: Not on file  Social Connections: Not on file  Intimate Partner Violence: Not on file    Past Surgical History:  Procedure Laterality Date   CATARACT EXTRACTION W/ INTRAOCULAR LENS IMPLANT Bilateral 2012   CHOLECYSTECTOMY, LAPAROSCOPIC  1999   CYSTOSCOPY WITH STENT PLACEMENT Right 02/28/2023   Procedure: CYSTOSCOPY WITH RIGHT STENT PLACEMENT;  Surgeon: Heloise Purpura, MD;  Location: [facility];  Service: Urology;  Laterality: Right;   CYSTOSCOPY WITH STENT PLACEMENT Left 03/16/2023   Procedure: CYSTOSCOPY  WITH LEFT STENT PLACEMENT;  Surgeon: Heloise Purpura, MD;  Location: [facility];  Service: Urology;  Laterality: Left;   CYSTOSCOPY/URETEROSCOPY/HOLMIUM LASER/STENT PLACEMENT Bilateral 03/30/2023   Procedure: CYSTOSCOPY   RIGHT URETEROSCOPY/HOLMIUM LASER/ RIGHT STENT  EXCHANGE  LEFT RETROGRADE LEFT STENT LEFT URETEROSCOPY WITH LASER;  Surgeon: Bjorn Pippin, MD;  [facility];  Service: Urology;  Laterality: Bilateral;      Current Outpatient Medications:    Accu-Chek Softclix Lancets lancets, See admin instructions., Disp: , Rfl:    amLODipine (NORVASC) 5 MG tablet, Take 5 mg by mouth daily., Disp: , Rfl:    aspirin 81 MG tablet, Take 81 mg by mouth daily., Disp: , Rfl:    cholecalciferol (VITAMIN D) 1000 UNITS tablet, Take 1,000 Units by mouth daily., Disp: , Rfl:    CONTOUR NEXT TEST test strip, SMARTSIG:Via Meter, Disp: , Rfl:    Cyanocobalamin (VITAMIN B12) 1000 MCG TBCR, Take 1 tablet by mouth at bedtime., Disp: , Rfl:    ezetimibe-simvastatin (VYTORIN) 10-40 MG per tablet, Take 1 tablet by mouth every 3 (three) days., Disp: , Rfl:    glimepiride (AMARYL) 1 MG tablet, Take 1 mg by mouth as directed. Take 1 tablet by mouth daily in am if blood sugar > 150, Disp: , Rfl: 0   Iron, Ferrous Sulfate, 325 (65 Fe) MG TABS, Take 1 tablet by mouth daily., Disp: , Rfl:    metFORMIN (GLUCOPHAGE) 500 MG tablet, Take 1,000 mg by mouth 2 (two) times daily with a meal., Disp: , Rfl:    metoprolol tartrate (LOPRESSOR) 50 MG tablet, TAKE 1 TABLET BY MOUTH TWICE A DAY (Patient taking differently: Take 50 mg by mouth 2 (two) times daily.), Disp: 180 tablet, Rfl: 3   olmesartan (BENICAR) 40 MG tablet, Take 1 tablet (40 mg total) by mouth daily. Please keep upcoming appt for future refills. Thank you (Patient taking differently: Take 40 mg by mouth daily. Please keep upcoming appt for future refills. Thank you), Disp: 90 tablet, Rfl: 3   pantoprazole (PROTONIX) 40 MG tablet, Take 40 mg by mouth  daily., Disp: , Rfl:    Polyethyl Glycol-Propyl Glycol (SYSTANE OP), Place 1-2 drops into both eyes daily., Disp: , Rfl:    RYBELSUS 7 MG TABS, Take 1 tablet by mouth daily., Disp: , Rfl:     Physical Exam: Blood pressure 122/70, pulse 78, height 5' 3.5" (1.613 m), weight 170 lb 6.4 oz (77.3 kg), SpO2 98%.    Affect appropriate Healthy:  appears stated age HEENT: normal Neck supple with no adenopathy JVP normal no bruits no thyromegaly Lungs clear with no  wheezing and good diaphragmatic motion Heart:  S1/S2 no murmur, no rub, gallop or click PMI normal Abdomen: benighn, BS positve, no tenderness, no AAA no bruit.  No HSM or HJR Distal pulses intact with no bruits No edema Neuro non-focal Skin warm and dry No muscular weakness   Labs:   Lab Results  Component Value Date   WBC 12.2 (H) 02/28/2023   HGB 9.9 (L) 03/30/2023   HCT 29.0 (L) 03/30/2023   MCV 96.5 02/28/2023   PLT 241 02/28/2023   No results for input(s): "NA", "K", "CL", "CO2", "BUN", "CREATININE", "CALCIUM", "PROT", "BILITOT", "ALKPHOS", "ALT", "AST", "GLUCOSE" in the last 168 hours.  Invalid input(s): "LABALBU" No results found for: "CKTOTAL", "CKMB", "CKMBINDEX", "TROPONINI" No results found for: "CHOL" No results found for: "HDL" No results found for: "LDLCALC" No results found for: "TRIG" No results found for: "CHOLHDL" No results found for: "LDLDIRECT"    Radiology: No results found.  EKG: SR low voltage see HPI   ASSESSMENT AND PLAN:   Tachycardia:  overweight with anemia. Thyroid ok Echo with no structural heart issues EF 65-70% Monitor with SVT see above Started lopressor 25 mg bid on 12/16/21 Dose increased by Dr Elberta Fortis to 50 mg bid on 01/19/22 improved  HLDL  continue vytorin LDL at goal DM:  Discussed low carb diet.  Target hemoglobin A1c is 6.5 or less.  Continue current medications. HTN:  See above regarding chagnes Now on Benicar and norvasc latter started by primary 07/04/22  Urology:   bilateral uretal stones post stenting F/U Dr Annabell Howells Dyspnea:  observe for now Normal exam TTE 12/23/21 normal EF   F/U in a year   Signed: Charlton Haws 05/03/2023, 9:17 AM

## 2023-05-03 ENCOUNTER — Encounter: Payer: Self-pay | Admitting: Cardiovascular Disease

## 2023-05-03 ENCOUNTER — Ambulatory Visit: Payer: Medicare Other | Attending: Cardiovascular Disease | Admitting: Cardiovascular Disease

## 2023-05-03 VITALS — BP 122/70 | HR 78 | Ht 63.5 in | Wt 170.4 lb

## 2023-05-03 DIAGNOSIS — R000 Tachycardia, unspecified: Secondary | ICD-10-CM | POA: Diagnosis not present

## 2023-05-03 DIAGNOSIS — I10 Essential (primary) hypertension: Secondary | ICD-10-CM | POA: Insufficient documentation

## 2023-05-03 NOTE — Patient Instructions (Signed)
Medication Instructions:  Your physician recommends that you continue on your current medications as directed. Please refer to the Current Medication list given to you today.  *If you need a refill on your cardiac medications before your next appointment, please call your pharmacy*  Lab Work: If you have labs (blood work) drawn today and your tests are completely normal, you will receive your results only by: MyChart Message (if you have MyChart) OR A paper copy in the mail If you have any lab test that is abnormal or we need to change your treatment, we will call you to review the results.  Follow-Up: At [facility], you and your health needs are our priority.  As part of our continuing mission to provide you with exceptional heart care, we have created designated Provider Care Teams.  These Care Teams include your primary Cardiologist (physician) and Advanced Practice Providers (APPs -  Physician Assistants and Nurse Practitioners) who all work together to provide you with the care you need, when you need it.  We recommend signing up for the patient portal called "MyChart".  Sign up information is provided on this After Visit Summary.  MyChart is used to connect with patients for Virtual Visits (Telemedicine).  Patients are able to view lab/test results, encounter notes, upcoming appointments, etc.  Non-urgent messages can be sent to your provider as well.   To learn more about what you can do with MyChart, go to [email or web address].    Your next appointment:   1 year(s)  Provider:   Peter Nishan, MD     

## 2023-05-09 DIAGNOSIS — H18593 Other hereditary corneal dystrophies, bilateral: Secondary | ICD-10-CM | POA: Diagnosis not present

## 2023-05-09 DIAGNOSIS — H04123 Dry eye syndrome of bilateral lacrimal glands: Secondary | ICD-10-CM | POA: Diagnosis not present

## 2023-05-15 ENCOUNTER — Ambulatory Visit
Admission: RE | Admit: 2023-05-15 | Discharge: 2023-05-15 | Disposition: A | Discharge status: Home / Self Care | Payer: Medicare Other | Source: Ambulatory Visit | Attending: Family Medicine | Admitting: Family Medicine

## 2023-05-15 DIAGNOSIS — N958 Other specified menopausal and perimenopausal disorders: Secondary | ICD-10-CM | POA: Diagnosis not present

## 2023-05-15 DIAGNOSIS — Z1231 Encounter for screening mammogram for malignant neoplasm of breast: Secondary | ICD-10-CM

## 2023-05-15 DIAGNOSIS — M8580 Other specified disorders of bone density and structure, unspecified site: Secondary | ICD-10-CM

## 2023-05-15 DIAGNOSIS — M8588 Other specified disorders of bone density and structure, other site: Secondary | ICD-10-CM | POA: Diagnosis not present

## 2023-05-15 DIAGNOSIS — N189 Chronic kidney disease, unspecified: Secondary | ICD-10-CM | POA: Diagnosis not present

## 2023-05-15 DIAGNOSIS — E349 Endocrine disorder, unspecified: Secondary | ICD-10-CM | POA: Diagnosis not present

## 2023-05-25 DIAGNOSIS — N132 Hydronephrosis with renal and ureteral calculous obstruction: Secondary | ICD-10-CM | POA: Diagnosis not present

## 2023-06-06 DIAGNOSIS — Z6830 Body mass index (BMI) 30.0-30.9, adult: Secondary | ICD-10-CM | POA: Diagnosis not present

## 2023-06-06 DIAGNOSIS — B370 Candidal stomatitis: Secondary | ICD-10-CM | POA: Diagnosis not present

## 2023-06-06 DIAGNOSIS — U071 COVID-19: Secondary | ICD-10-CM | POA: Diagnosis not present

## 2023-06-14 DIAGNOSIS — H04123 Dry eye syndrome of bilateral lacrimal glands: Secondary | ICD-10-CM | POA: Diagnosis not present

## 2023-06-14 DIAGNOSIS — H18593 Other hereditary corneal dystrophies, bilateral: Secondary | ICD-10-CM | POA: Diagnosis not present

## 2023-06-14 DIAGNOSIS — H40013 Open angle with borderline findings, low risk, bilateral: Secondary | ICD-10-CM | POA: Diagnosis not present

## 2023-06-19 DIAGNOSIS — E1165 Type 2 diabetes mellitus with hyperglycemia: Secondary | ICD-10-CM | POA: Diagnosis not present

## 2023-06-26 DIAGNOSIS — N1831 Chronic kidney disease, stage 3a: Secondary | ICD-10-CM | POA: Diagnosis not present

## 2023-06-26 DIAGNOSIS — I10 Essential (primary) hypertension: Secondary | ICD-10-CM | POA: Diagnosis not present

## 2023-06-26 DIAGNOSIS — E1165 Type 2 diabetes mellitus with hyperglycemia: Secondary | ICD-10-CM | POA: Diagnosis not present

## 2023-06-26 DIAGNOSIS — E785 Hyperlipidemia, unspecified: Secondary | ICD-10-CM | POA: Diagnosis not present

## 2023-07-06 DIAGNOSIS — R432 Parageusia: Secondary | ICD-10-CM | POA: Diagnosis not present

## 2023-07-10 DIAGNOSIS — Z86010 Personal history of colonic polyps: Secondary | ICD-10-CM | POA: Diagnosis not present

## 2023-07-10 DIAGNOSIS — R140 Abdominal distension (gaseous): Secondary | ICD-10-CM | POA: Diagnosis not present

## 2023-08-17 DIAGNOSIS — M25552 Pain in left hip: Secondary | ICD-10-CM | POA: Diagnosis not present

## 2023-08-17 DIAGNOSIS — M7062 Trochanteric bursitis, left hip: Secondary | ICD-10-CM | POA: Diagnosis not present

## 2023-08-22 DIAGNOSIS — M170 Bilateral primary osteoarthritis of knee: Secondary | ICD-10-CM | POA: Diagnosis not present

## 2023-08-23 DIAGNOSIS — S0181XA Laceration without foreign body of other part of head, initial encounter: Secondary | ICD-10-CM | POA: Diagnosis not present

## 2023-08-23 DIAGNOSIS — S81011A Laceration without foreign body, right knee, initial encounter: Secondary | ICD-10-CM | POA: Diagnosis not present

## 2023-08-24 DIAGNOSIS — M25532 Pain in left wrist: Secondary | ICD-10-CM | POA: Diagnosis not present

## 2023-08-24 DIAGNOSIS — M25522 Pain in left elbow: Secondary | ICD-10-CM | POA: Diagnosis not present

## 2023-08-29 DIAGNOSIS — M170 Bilateral primary osteoarthritis of knee: Secondary | ICD-10-CM | POA: Diagnosis not present

## 2023-08-29 DIAGNOSIS — S8001XA Contusion of right knee, initial encounter: Secondary | ICD-10-CM | POA: Diagnosis not present

## 2023-08-31 DIAGNOSIS — S81011D Laceration without foreign body, right knee, subsequent encounter: Secondary | ICD-10-CM | POA: Diagnosis not present

## 2023-08-31 DIAGNOSIS — S01511D Laceration without foreign body of lip, subsequent encounter: Secondary | ICD-10-CM | POA: Diagnosis not present

## 2023-09-05 DIAGNOSIS — S81011D Laceration without foreign body, right knee, subsequent encounter: Secondary | ICD-10-CM | POA: Diagnosis not present

## 2023-09-18 ENCOUNTER — Other Ambulatory Visit (HOSPITAL_BASED_OUTPATIENT_CLINIC_OR_DEPARTMENT_OTHER): Payer: Self-pay

## 2023-09-18 DIAGNOSIS — Z23 Encounter for immunization: Secondary | ICD-10-CM | POA: Diagnosis not present

## 2023-09-18 DIAGNOSIS — M25532 Pain in left wrist: Secondary | ICD-10-CM | POA: Diagnosis not present

## 2023-09-18 DIAGNOSIS — S52132A Displaced fracture of neck of left radius, initial encounter for closed fracture: Secondary | ICD-10-CM | POA: Diagnosis not present

## 2023-09-18 DIAGNOSIS — M79632 Pain in left forearm: Secondary | ICD-10-CM | POA: Diagnosis not present

## 2023-09-18 MED ORDER — FLUAD 0.5 ML IM SUSY
0.5000 mL | PREFILLED_SYRINGE | Freq: Once | INTRAMUSCULAR | 0 refills | Status: AC
  Filled 2023-09-18: qty 0.5, 1d supply, fill #0

## 2023-10-22 DIAGNOSIS — S52132A Displaced fracture of neck of left radius, initial encounter for closed fracture: Secondary | ICD-10-CM | POA: Diagnosis not present

## 2023-10-23 DIAGNOSIS — D692 Other nonthrombocytopenic purpura: Secondary | ICD-10-CM | POA: Diagnosis not present

## 2023-10-23 DIAGNOSIS — L821 Other seborrheic keratosis: Secondary | ICD-10-CM | POA: Diagnosis not present

## 2023-10-23 DIAGNOSIS — Z85828 Personal history of other malignant neoplasm of skin: Secondary | ICD-10-CM | POA: Diagnosis not present

## 2023-10-23 DIAGNOSIS — Z859 Personal history of malignant neoplasm, unspecified: Secondary | ICD-10-CM | POA: Diagnosis not present

## 2023-10-23 DIAGNOSIS — L578 Other skin changes due to chronic exposure to nonionizing radiation: Secondary | ICD-10-CM | POA: Diagnosis not present

## 2023-10-24 DIAGNOSIS — E1165 Type 2 diabetes mellitus with hyperglycemia: Secondary | ICD-10-CM | POA: Diagnosis not present

## 2023-10-31 DIAGNOSIS — N1831 Chronic kidney disease, stage 3a: Secondary | ICD-10-CM | POA: Diagnosis not present

## 2023-10-31 DIAGNOSIS — E1165 Type 2 diabetes mellitus with hyperglycemia: Secondary | ICD-10-CM | POA: Diagnosis not present

## 2023-10-31 DIAGNOSIS — I10 Essential (primary) hypertension: Secondary | ICD-10-CM | POA: Diagnosis not present

## 2023-11-16 DIAGNOSIS — T148XXA Other injury of unspecified body region, initial encounter: Secondary | ICD-10-CM | POA: Diagnosis not present

## 2023-11-16 DIAGNOSIS — S40812A Abrasion of left upper arm, initial encounter: Secondary | ICD-10-CM | POA: Diagnosis not present

## 2023-11-30 DIAGNOSIS — M7062 Trochanteric bursitis, left hip: Secondary | ICD-10-CM | POA: Diagnosis not present

## 2023-12-04 DIAGNOSIS — S52132A Displaced fracture of neck of left radius, initial encounter for closed fracture: Secondary | ICD-10-CM | POA: Diagnosis not present

## 2023-12-11 DIAGNOSIS — Z0000 Encounter for general adult medical examination without abnormal findings: Secondary | ICD-10-CM | POA: Diagnosis not present

## 2023-12-11 DIAGNOSIS — N1831 Chronic kidney disease, stage 3a: Secondary | ICD-10-CM | POA: Diagnosis not present

## 2023-12-11 DIAGNOSIS — Z1331 Encounter for screening for depression: Secondary | ICD-10-CM | POA: Diagnosis not present

## 2023-12-11 DIAGNOSIS — E1165 Type 2 diabetes mellitus with hyperglycemia: Secondary | ICD-10-CM | POA: Diagnosis not present

## 2023-12-11 DIAGNOSIS — E785 Hyperlipidemia, unspecified: Secondary | ICD-10-CM | POA: Diagnosis not present

## 2023-12-11 DIAGNOSIS — D649 Anemia, unspecified: Secondary | ICD-10-CM | POA: Diagnosis not present

## 2023-12-11 DIAGNOSIS — I10 Essential (primary) hypertension: Secondary | ICD-10-CM | POA: Diagnosis not present

## 2023-12-31 DIAGNOSIS — R1032 Left lower quadrant pain: Secondary | ICD-10-CM | POA: Diagnosis not present

## 2024-01-10 ENCOUNTER — Other Ambulatory Visit: Payer: Self-pay | Admitting: Cardiology

## 2024-01-15 DIAGNOSIS — S52132A Displaced fracture of neck of left radius, initial encounter for closed fracture: Secondary | ICD-10-CM | POA: Diagnosis not present

## 2024-02-26 DIAGNOSIS — E1165 Type 2 diabetes mellitus with hyperglycemia: Secondary | ICD-10-CM | POA: Diagnosis not present

## 2024-03-04 DIAGNOSIS — I10 Essential (primary) hypertension: Secondary | ICD-10-CM | POA: Diagnosis not present

## 2024-03-04 DIAGNOSIS — E1165 Type 2 diabetes mellitus with hyperglycemia: Secondary | ICD-10-CM | POA: Diagnosis not present

## 2024-03-04 DIAGNOSIS — N1831 Chronic kidney disease, stage 3a: Secondary | ICD-10-CM | POA: Diagnosis not present

## 2024-03-04 DIAGNOSIS — E785 Hyperlipidemia, unspecified: Secondary | ICD-10-CM | POA: Diagnosis not present

## 2024-03-31 ENCOUNTER — Other Ambulatory Visit: Payer: Self-pay | Admitting: Family Medicine

## 2024-03-31 DIAGNOSIS — Z1231 Encounter for screening mammogram for malignant neoplasm of breast: Secondary | ICD-10-CM

## 2024-04-28 DIAGNOSIS — M1711 Unilateral primary osteoarthritis, right knee: Secondary | ICD-10-CM | POA: Diagnosis not present

## 2024-05-16 ENCOUNTER — Ambulatory Visit
Admission: RE | Admit: 2024-05-16 | Discharge: 2024-05-16 | Disposition: A | Discharge status: Home / Self Care | Source: Ambulatory Visit | Attending: Family Medicine | Admitting: Family Medicine

## 2024-05-16 DIAGNOSIS — Z1231 Encounter for screening mammogram for malignant neoplasm of breast: Secondary | ICD-10-CM | POA: Diagnosis not present

## 2024-06-02 DIAGNOSIS — E1165 Type 2 diabetes mellitus with hyperglycemia: Secondary | ICD-10-CM | POA: Diagnosis not present

## 2024-06-08 NOTE — Progress Notes (Signed)
 CARDIOLOGY CONSULT NOTE       Patient ID: Tiffany Weeks MRN: [id number] DOB/AGE: Jan 22, 1951 73 y.o.   Primary Physician: Dayna Motto, DO Primary Cardiologist: Delford Reason for Consultation: Tachycardia    HPI:  73 y.o. referred by Dr Cyrena for tachycardia. History of DM, HTN, palpitations History of atypical chest pain with normla myovue in 2013 and 2017 Event monitor with no arrhythmias Seen by Dr Cyrena 12/05/21 complained of elevated HR since November Atypical chest pain sharp for 2 weeks associated with belching ? GERD Had kidney stone in winter and HR elevated Pulse 102 in office A1c is 6.3 improved BUN/CR ok Hct 33.3 slightly worse LDL 57 Thyroid  studies normal T4 8.9 TSH 4.6  Seen in ED 08/16/21 with abdominal / flank pain WBC elevated blood in urine CT with 2 mm proximal left ureteral stone mild obstructive uropathy Was to f/u with [facility]   ECG 05/03/22 SR rate 103 low voltage   Monitor 12/26/21 with average HR 97 bpm Had 107 SVT rund longest 37.7 seconds  at HR 108 bpm with symptoms   Seen by Dr Inocencio 01/19/22 and lopressor  increased to 50 mg bid  Has a cat Smokey to keep her company Does day trips with [employer] [facility]  Primary started her on Norvasc  for BP  Been Rx by Dr Watt for right uretal stone with cystoscopy , laser and stent exchange 03/30/23   Has had some fatigue and exertional dyspnea since dealing with kidney stones EF normal on TTE 12/23/21  Having atypical chest pain last few months. One long episode sharp pain radiating to neck lasted 2 hours. Can get weekly pain at water  aerobics as well  ROS All other systems reviewed and negative except as noted above  Past Medical History:  Diagnosis Date   CKD (chronic kidney disease), stage III (HCC)    Heart palpitations    History of asthma    03-26-2023  per  pt hx exercised induced asthma > 30 yrs ago no issue since   History of kidney stones    Hyperlipidemia    IDA (iron deficiency anemia)     Nonsustained supraventricular tachycardia [facility])    cardiologist-- dr delford;   issue since 2013;  last event monitor in epic 12-26-2021 had 107 SVT episodes the longest run 37 seconds,  normal echo 12-23-2021,  normal nuclear study 02-09-2016   OA (osteoarthritis)    Staghorn renal calculus 02/2023   left partial   Type 2 diabetes mellitus (HCC)    followed by endocrinologist--- harlene kiang NP ([facility])    (03-26-2023   per p checks blood sugar   Ureteral calculus, right 02/2023   Vitamin D deficiency    Wears glasses     Family History  Problem Relation Age of Onset   Diabetes Mother    Hypertension Mother    Heart failure Mother    Heart disease Mother    Liver disease Father    Liver disease Other    Aortic stenosis Other    Hyperlipidemia Other    Thyroid  disease Other    Breast cancer Other    Breast cancer Sister 42    Social History   Socioeconomic History   Marital status: Single    Spouse name: Not on file   Number of children: Not on file   Years of education: Not on file   Highest education level: Not on file  Occupational History   Not on file  Tobacco  Use   Smoking status: Never   Smokeless tobacco: Never  Vaping Use   Vaping status: Never Used  Substance and Sexual Activity   Alcohol use: No   Drug use: Never   Sexual activity: Not on file  Other Topics Concern   Not on file  Social History Narrative   Not on file   Social Drivers of Health   Financial Resource Strain: Not on file  Food Insecurity: Not on file  Transportation Needs: Not on file  Physical Activity: Not on file  Stress: Not on file  Social Connections: Not on file  Intimate Partner Violence: Not on file    Past Surgical History:  Procedure Laterality Date   CATARACT EXTRACTION W/ INTRAOCULAR LENS IMPLANT Bilateral 2012   CHOLECYSTECTOMY, LAPAROSCOPIC  1999   CYSTOSCOPY WITH STENT PLACEMENT Right 02/28/2023   Procedure: CYSTOSCOPY WITH RIGHT STENT PLACEMENT;   Surgeon: Renda Glance, MD;  Location: [facility];  Service: Urology;  Laterality: Right;   CYSTOSCOPY WITH STENT PLACEMENT Left 03/16/2023   Procedure: CYSTOSCOPY WITH LEFT STENT PLACEMENT;  Surgeon: Renda Glance, MD;  Location: [facility];  Service: Urology;  Laterality: Left;   CYSTOSCOPY/URETEROSCOPY/HOLMIUM LASER/STENT PLACEMENT Bilateral 03/30/2023   Procedure: CYSTOSCOPY   RIGHT URETEROSCOPY/HOLMIUM LASER/ RIGHT STENT  EXCHANGE  LEFT RETROGRADE LEFT STENT LEFT URETEROSCOPY WITH LASER;  Surgeon: Watt Rush, MD;  Location: [facility];  Service: Urology;  Laterality: Bilateral;      Current Outpatient Medications:    Accu-Chek Softclix Lancets lancets, See admin instructions., Disp: , Rfl:    amLODipine  (NORVASC ) 5 MG tablet, Take 5 mg by mouth daily., Disp: , Rfl:    aspirin 81 MG tablet, Take 81 mg by mouth daily., Disp: , Rfl:    cholecalciferol (VITAMIN D) 1000 UNITS tablet, Take 1,000 Units by mouth daily., Disp: , Rfl:    CONTOUR NEXT TEST test strip, SMARTSIG:Via Meter, Disp: , Rfl:    Cyanocobalamin  (VITAMIN B12) 1000 MCG TBCR, Take 1 tablet by mouth at bedtime., Disp: , Rfl:    ezetimibe-simvastatin (VYTORIN) 10-40 MG per tablet, Take 1 tablet by mouth every 3 (three) days., Disp: , Rfl:    glimepiride (AMARYL) 1 MG tablet, Take 1 mg by mouth as directed. Take 1 tablet by mouth daily in am if blood sugar > 150, Disp: , Rfl: 0   Iron, Ferrous Sulfate, 325 (65 Fe) MG TABS, Take 1 tablet by mouth daily., Disp: , Rfl:    metFORMIN (GLUCOPHAGE) 500 MG tablet, Take 1,000 mg by mouth 2 (two) times daily with a meal., Disp: , Rfl:    metoprolol  tartrate (LOPRESSOR ) 50 MG tablet, TAKE 1 TABLET BY MOUTH TWICE A DAY, Disp: 180 tablet, Rfl: 1   olmesartan  (BENICAR ) 40 MG tablet, Take 1 tablet (40 mg total) by mouth daily. Please keep upcoming appt for future refills. Thank you (Patient taking differently: Take 40 mg by mouth daily. Please keep upcoming appt for future refills. Thank  you), Disp: 90 tablet, Rfl: 3   pantoprazole (PROTONIX) 40 MG tablet, Take 40 mg by mouth daily., Disp: , Rfl:    Polyethyl Glycol-Propyl Glycol (SYSTANE OP), Place 1-2 drops into both eyes daily., Disp: , Rfl:    RYBELSUS 7 MG TABS, Take 1 tablet by mouth daily., Disp: , Rfl:     Physical Exam: Blood pressure (!) 150/78, pulse 82, height 5' 3.5 (1.613 m), weight 175 lb (79.4 kg), SpO2 98%.    Affect appropriate Healthy:  appears stated age HEENT:  normal Neck supple with no adenopathy JVP normal no bruits no thyromegaly Lungs clear with no wheezing and good diaphragmatic motion Heart:  S1/S2 no murmur, no rub, gallop or click PMI normal Abdomen: benighn, BS positve, no tenderness, no AAA no bruit.  No HSM or HJR Distal pulses intact with no bruits No edema Neuro non-focal Skin warm and dry No muscular weakness   Labs:   Lab Results  Component Value Date   WBC 12.2 (H) 02/28/2023   HGB 9.9 (L) 03/30/2023   HCT 29.0 (L) 03/30/2023   MCV 96.5 02/28/2023   PLT 241 02/28/2023   No results for input(s): NA, K, CL, CO2, BUN, CREATININE, CALCIUM, PROT, BILITOT, ALKPHOS, ALT, AST, GLUCOSE in the last 168 hours.  Invalid input(s): LABALBU No results found for: CKTOTAL, CKMB, CKMBINDEX, TROPONINI No results found for: CHOL No results found for: HDL No results found for: LDLCALC No results found for: TRIG No results found for: CHOLHDL No results found for: LDLDIRECT    Radiology: No results found.   EKG: SR low voltage see HPI SR rate 81 normal 06/19/2024    ASSESSMENT AND PLAN:   Tachycardia:  overweight with anemia. Thyroid  ok Echo with no structural heart issues EF 65-70% Monitor with SVT see above Started lopressor  25 mg bid on 12/16/21 Dose increased by Dr Inocencio to 50 mg bid on 01/19/22 improved  HLDL  continue vytorin LDL at goal DM:  Discussed low carb diet.  Target hemoglobin A1c is 6.5 or less.  Continue current  medications. HTN:  See above regarding chagnes Now on Benicar  and norvasc  latter started by primary 07/04/22  Urology:  bilateral uretal stones post stenting F/U Dr Watt Dyspnea:  observe for now Normal exam TTE 12/23/21 normal EF  Chest Pain: atypical but worse last few months. Given age and risk factors schedule exercise myovue  Ex Myovue   F/U in a year if stress test low risk   Signed: Maude Emmer 06/19/2024, 9:37 AM

## 2024-06-09 DIAGNOSIS — E785 Hyperlipidemia, unspecified: Secondary | ICD-10-CM | POA: Diagnosis not present

## 2024-06-09 DIAGNOSIS — E1165 Type 2 diabetes mellitus with hyperglycemia: Secondary | ICD-10-CM | POA: Diagnosis not present

## 2024-06-09 DIAGNOSIS — N1831 Chronic kidney disease, stage 3a: Secondary | ICD-10-CM | POA: Diagnosis not present

## 2024-06-09 DIAGNOSIS — I10 Essential (primary) hypertension: Secondary | ICD-10-CM | POA: Diagnosis not present

## 2024-06-10 DIAGNOSIS — E785 Hyperlipidemia, unspecified: Secondary | ICD-10-CM | POA: Diagnosis not present

## 2024-06-10 DIAGNOSIS — R799 Abnormal finding of blood chemistry, unspecified: Secondary | ICD-10-CM | POA: Diagnosis not present

## 2024-06-10 DIAGNOSIS — N183 Chronic kidney disease, stage 3 unspecified: Secondary | ICD-10-CM | POA: Diagnosis not present

## 2024-06-10 DIAGNOSIS — I10 Essential (primary) hypertension: Secondary | ICD-10-CM | POA: Diagnosis not present

## 2024-06-10 DIAGNOSIS — I129 Hypertensive chronic kidney disease with stage 1 through stage 4 chronic kidney disease, or unspecified chronic kidney disease: Secondary | ICD-10-CM | POA: Diagnosis not present

## 2024-06-10 DIAGNOSIS — M545 Low back pain, unspecified: Secondary | ICD-10-CM | POA: Diagnosis not present

## 2024-06-10 DIAGNOSIS — E559 Vitamin D deficiency, unspecified: Secondary | ICD-10-CM | POA: Diagnosis not present

## 2024-06-10 DIAGNOSIS — E1122 Type 2 diabetes mellitus with diabetic chronic kidney disease: Secondary | ICD-10-CM | POA: Diagnosis not present

## 2024-06-10 DIAGNOSIS — E1165 Type 2 diabetes mellitus with hyperglycemia: Secondary | ICD-10-CM | POA: Diagnosis not present

## 2024-06-18 DIAGNOSIS — S336XXA Sprain of sacroiliac joint, initial encounter: Secondary | ICD-10-CM | POA: Diagnosis not present

## 2024-06-18 DIAGNOSIS — M6281 Muscle weakness (generalized): Secondary | ICD-10-CM | POA: Diagnosis not present

## 2024-06-18 DIAGNOSIS — M545 Low back pain, unspecified: Secondary | ICD-10-CM | POA: Diagnosis not present

## 2024-06-18 DIAGNOSIS — E119 Type 2 diabetes mellitus without complications: Secondary | ICD-10-CM | POA: Diagnosis not present

## 2024-06-18 DIAGNOSIS — H40013 Open angle with borderline findings, low risk, bilateral: Secondary | ICD-10-CM | POA: Diagnosis not present

## 2024-06-18 DIAGNOSIS — H04123 Dry eye syndrome of bilateral lacrimal glands: Secondary | ICD-10-CM | POA: Diagnosis not present

## 2024-06-18 DIAGNOSIS — M62830 Muscle spasm of back: Secondary | ICD-10-CM | POA: Diagnosis not present

## 2024-06-18 DIAGNOSIS — H43813 Vitreous degeneration, bilateral: Secondary | ICD-10-CM | POA: Diagnosis not present

## 2024-06-19 ENCOUNTER — Ambulatory Visit: Attending: Cardiovascular Disease | Admitting: Cardiovascular Disease

## 2024-06-19 VITALS — BP 150/78 | HR 82 | Ht 63.5 in | Wt 175.0 lb

## 2024-06-19 DIAGNOSIS — I10 Essential (primary) hypertension: Secondary | ICD-10-CM | POA: Diagnosis not present

## 2024-06-19 DIAGNOSIS — R000 Tachycardia, unspecified: Secondary | ICD-10-CM | POA: Insufficient documentation

## 2024-06-19 DIAGNOSIS — R072 Precordial pain: Secondary | ICD-10-CM | POA: Insufficient documentation

## 2024-06-19 NOTE — Patient Instructions (Addendum)
 Medication Instructions:  Your physician recommends that you continue on your current medications as directed. Please refer to the Current Medication list given to you today.  *If you need a refill on your cardiac medications before your next appointment, please call your pharmacy*  Lab Work: If you have labs (blood work) drawn today and your tests are completely normal, you will receive your results only by: MyChart Message (if you have MyChart) OR A paper copy in the mail If you have any lab test that is abnormal or we need to change your treatment, we will call you to review the results.  Testing/Procedures: Your physician has requested that you have en exercise stress myoview. For further information please visit [email or web address] Please follow instruction sheet, as given.  Follow-Up: At [facility], you and your health needs are our priority.  As part of our continuing mission to provide you with exceptional heart care, our providers are all part of one team.  This team includes your primary Cardiologist (physician) and Advanced Practice Providers or APPs (Physician Assistants and Nurse Practitioners) who all work together to provide you with the care you need, when you need it.  Your next appointment:   1 year(s)  Provider:   Maude Emmer, MD    We recommend signing up for the patient portal called MyChart.  Sign up information is provided on this After Visit Summary.  MyChart is used to connect with patients for Virtual Visits (Telemedicine).  Patients are able to view lab/test results, encounter notes, upcoming appointments, etc.  Non-urgent messages can be sent to your provider as well.   To learn more about what you can do with MyChart, go to [email or web address].   Other Instructions Your physician has requested that you have a lexiscan myoview. For further information please visit [email or web address]   How to Prepare for Your Myoview Test:        A.  Nothing to eat or drink 3 hours prior to arrival time, except you may drink water        B. No Caffeine/Decaffeinated products or chocolate 12 hours prior to arrival.       C. No Cologne or Lotion       D. Wear comfortable walking shoes       E. Total time is 3 to 4 hours; you may want to bring reading material for the waiting time. If someone comes with you, they will need to remain in the lobby due to           limited space in the testing area.        F. Medication Instructions: hold your metoprolol  24 hours prior to test.  After You Arrive:  Once you arrive in the Nuclear Cardiology lab, an IV will be started, then the Technologist will inject a small amount of radioactive tracer. There will be a 1 hour waiting period after this injection. A series of pictures will be taken of your heart following this waiting period. You will be prepped for the stress portion of the test. During the stress portion of your test a small amount of radioactive tracer will be injected in your IV. After the stress portion, there is a short rest period during which time your heart and blood pressure will be monitored. After the short test period the Technologist will begin your second set of pictures. Your doctor will inform you of your test results within 7 days.  In preparation for your appointment, medication, and supplies will be  purchased. Appointment availability is limited, so if you need to cancel or reschedule please call the Nuclear Department at [phone], 24 hours in advance to avoid a cancellation fee of $50.00

## 2024-06-20 DIAGNOSIS — S336XXA Sprain of sacroiliac joint, initial encounter: Secondary | ICD-10-CM | POA: Diagnosis not present

## 2024-06-20 DIAGNOSIS — M62830 Muscle spasm of back: Secondary | ICD-10-CM | POA: Diagnosis not present

## 2024-06-20 DIAGNOSIS — M6281 Muscle weakness (generalized): Secondary | ICD-10-CM | POA: Diagnosis not present

## 2024-06-20 DIAGNOSIS — M545 Low back pain, unspecified: Secondary | ICD-10-CM | POA: Diagnosis not present

## 2024-06-23 ENCOUNTER — Telehealth (HOSPITAL_COMMUNITY): Payer: Self-pay

## 2024-06-23 NOTE — Telephone Encounter (Signed)
 Detailed instructions left on the patient's answering machine. Tiffany Weeks CCT

## 2024-06-24 ENCOUNTER — Encounter (HOSPITAL_COMMUNITY): Payer: Self-pay

## 2024-06-25 ENCOUNTER — Other Ambulatory Visit: Payer: Self-pay

## 2024-06-25 DIAGNOSIS — I10 Essential (primary) hypertension: Secondary | ICD-10-CM

## 2024-06-25 DIAGNOSIS — R000 Tachycardia, unspecified: Secondary | ICD-10-CM

## 2024-06-25 DIAGNOSIS — R072 Precordial pain: Secondary | ICD-10-CM

## 2024-06-25 DIAGNOSIS — M6281 Muscle weakness (generalized): Secondary | ICD-10-CM | POA: Diagnosis not present

## 2024-06-25 DIAGNOSIS — S336XXA Sprain of sacroiliac joint, initial encounter: Secondary | ICD-10-CM | POA: Diagnosis not present

## 2024-06-25 DIAGNOSIS — M545 Low back pain, unspecified: Secondary | ICD-10-CM | POA: Diagnosis not present

## 2024-06-25 DIAGNOSIS — M62830 Muscle spasm of back: Secondary | ICD-10-CM | POA: Diagnosis not present

## 2024-06-25 NOTE — Progress Notes (Unsigned)
 Placed order for sign attestation.

## 2024-06-27 ENCOUNTER — Other Ambulatory Visit: Payer: Self-pay | Admitting: Cardiovascular Disease

## 2024-06-27 DIAGNOSIS — R072 Precordial pain: Secondary | ICD-10-CM

## 2024-06-30 DIAGNOSIS — M6281 Muscle weakness (generalized): Secondary | ICD-10-CM | POA: Diagnosis not present

## 2024-06-30 DIAGNOSIS — M545 Low back pain, unspecified: Secondary | ICD-10-CM | POA: Diagnosis not present

## 2024-06-30 DIAGNOSIS — M62830 Muscle spasm of back: Secondary | ICD-10-CM | POA: Diagnosis not present

## 2024-06-30 DIAGNOSIS — S336XXA Sprain of sacroiliac joint, initial encounter: Secondary | ICD-10-CM | POA: Diagnosis not present

## 2024-07-01 ENCOUNTER — Ambulatory Visit: Payer: Self-pay | Admitting: Cardiovascular Disease

## 2024-07-01 ENCOUNTER — Ambulatory Visit (HOSPITAL_COMMUNITY)
Admission: RE | Admit: 2024-07-01 | Discharge: 2024-07-01 | Disposition: A | Discharge status: Home / Self Care | Source: Ambulatory Visit | Attending: Cardiovascular Disease | Admitting: Cardiovascular Disease

## 2024-07-01 DIAGNOSIS — R072 Precordial pain: Secondary | ICD-10-CM | POA: Insufficient documentation

## 2024-07-01 LAB — MYOCARDIAL PERFUSION IMAGING
Angina Index: 0
Base ST Depression (mm): 0 mm
Duke Treadmill Score: 0
Estimated workload: 4.6
Exercise duration (min): 5 min
Exercise duration (sec): 9 s
LV dias vol: 45 mL (ref 46–106)
LV sys vol: 9 mL (ref 3.8–5.2)
MPHR: 146 {beats}/min
Nuc Stress EF: 80 %
Peak HR: 153 {beats}/min
Percent HR: 104 %
Rest HR: 76 {beats}/min
Rest Nuclear Isotope Dose: 10.9 mCi
SDS: 1
SRS: 5
SSS: 2
ST Depression (mm): 1 mm
Stress Nuclear Isotope Dose: 32.8 mCi
TID: 0.93

## 2024-07-01 MED ORDER — TECHNETIUM TC 99M TETROFOSMIN IV KIT
32.8000 | PACK | Freq: Once | INTRAVENOUS | Status: AC | PRN
  Administered 2024-07-01: 32.8 via INTRAVENOUS

## 2024-07-01 MED ORDER — TECHNETIUM TC 99M TETROFOSMIN IV KIT
10.9000 | PACK | Freq: Once | INTRAVENOUS | Status: AC | PRN
  Administered 2024-07-01: 10.9 via INTRAVENOUS

## 2024-07-02 DIAGNOSIS — S336XXA Sprain of sacroiliac joint, initial encounter: Secondary | ICD-10-CM | POA: Diagnosis not present

## 2024-07-02 DIAGNOSIS — M545 Low back pain, unspecified: Secondary | ICD-10-CM | POA: Diagnosis not present

## 2024-07-02 DIAGNOSIS — M6281 Muscle weakness (generalized): Secondary | ICD-10-CM | POA: Diagnosis not present

## 2024-07-02 DIAGNOSIS — M62830 Muscle spasm of back: Secondary | ICD-10-CM | POA: Diagnosis not present

## 2024-07-07 DIAGNOSIS — M545 Low back pain, unspecified: Secondary | ICD-10-CM | POA: Diagnosis not present

## 2024-07-07 DIAGNOSIS — S336XXA Sprain of sacroiliac joint, initial encounter: Secondary | ICD-10-CM | POA: Diagnosis not present

## 2024-07-07 DIAGNOSIS — M6281 Muscle weakness (generalized): Secondary | ICD-10-CM | POA: Diagnosis not present

## 2024-07-07 DIAGNOSIS — M62830 Muscle spasm of back: Secondary | ICD-10-CM | POA: Diagnosis not present

## 2024-07-09 DIAGNOSIS — M6281 Muscle weakness (generalized): Secondary | ICD-10-CM | POA: Diagnosis not present

## 2024-07-09 DIAGNOSIS — M62830 Muscle spasm of back: Secondary | ICD-10-CM | POA: Diagnosis not present

## 2024-07-09 DIAGNOSIS — S336XXA Sprain of sacroiliac joint, initial encounter: Secondary | ICD-10-CM | POA: Diagnosis not present

## 2024-07-09 DIAGNOSIS — M545 Low back pain, unspecified: Secondary | ICD-10-CM | POA: Diagnosis not present

## 2024-07-17 ENCOUNTER — Telehealth: Payer: Self-pay | Admitting: Cardiology

## 2024-07-17 MED ORDER — METOPROLOL TARTRATE 50 MG PO TABS: 50.0000 mg | ORAL_TABLET | Freq: Two times a day (BID) | ORAL | 3 refills | Status: AC

## 2024-07-17 NOTE — Telephone Encounter (Signed)
*  STAT* If patient is at the pharmacy, call can be transferred to refill team.   1. Which medications need to be refilled? (please list name of each medication and dose if known) metoprolol  tartrate (LOPRESSOR ) 50 MG tablet    2. Would you like to learn more about the convenience, safety, & potential cost savings by using the [facility]?     3. Are you open to using the [facility] (Type [facility].  ).   4. Which pharmacy/location (including street and city if local pharmacy) is medication to be sent to? [id number] - [place],  - [address]    5. Do they need a 30 day or 90 day supply? 90 day

## 2024-07-17 NOTE — Telephone Encounter (Signed)
 Pt's medication was sent to pt's pharmacy as requested. Confirmation received.

## 2024-07-18 DIAGNOSIS — M170 Bilateral primary osteoarthritis of knee: Secondary | ICD-10-CM | POA: Diagnosis not present

## 2024-07-21 DIAGNOSIS — M545 Low back pain, unspecified: Secondary | ICD-10-CM | POA: Diagnosis not present

## 2024-07-21 DIAGNOSIS — S336XXA Sprain of sacroiliac joint, initial encounter: Secondary | ICD-10-CM | POA: Diagnosis not present

## 2024-07-21 DIAGNOSIS — M6281 Muscle weakness (generalized): Secondary | ICD-10-CM | POA: Diagnosis not present

## 2024-07-21 DIAGNOSIS — M62830 Muscle spasm of back: Secondary | ICD-10-CM | POA: Diagnosis not present

## 2024-07-28 DIAGNOSIS — M7062 Trochanteric bursitis, left hip: Secondary | ICD-10-CM | POA: Diagnosis not present

## 2024-08-07 ENCOUNTER — Other Ambulatory Visit (HOSPITAL_BASED_OUTPATIENT_CLINIC_OR_DEPARTMENT_OTHER): Payer: Self-pay

## 2024-08-07 DIAGNOSIS — Z23 Encounter for immunization: Secondary | ICD-10-CM | POA: Diagnosis not present

## 2024-08-07 MED ORDER — FLUZONE HIGH-DOSE 0.5 ML IM SUSY
0.5000 mL | PREFILLED_SYRINGE | Freq: Once | INTRAMUSCULAR | 0 refills | Status: AC
  Filled 2024-08-07: qty 0.5, 1d supply, fill #0

## 2024-08-11 DIAGNOSIS — M5459 Other low back pain: Secondary | ICD-10-CM | POA: Diagnosis not present

## 2024-08-11 DIAGNOSIS — M545 Low back pain, unspecified: Secondary | ICD-10-CM | POA: Diagnosis not present

## 2024-08-14 DIAGNOSIS — E1165 Type 2 diabetes mellitus with hyperglycemia: Secondary | ICD-10-CM | POA: Diagnosis not present

## 2024-08-20 DIAGNOSIS — M25562 Pain in left knee: Secondary | ICD-10-CM | POA: Diagnosis not present

## 2024-08-20 DIAGNOSIS — M25561 Pain in right knee: Secondary | ICD-10-CM | POA: Diagnosis not present

## 2024-08-20 DIAGNOSIS — M170 Bilateral primary osteoarthritis of knee: Secondary | ICD-10-CM | POA: Diagnosis not present

## 2024-08-21 DIAGNOSIS — I10 Essential (primary) hypertension: Secondary | ICD-10-CM | POA: Diagnosis not present

## 2024-08-21 DIAGNOSIS — E785 Hyperlipidemia, unspecified: Secondary | ICD-10-CM | POA: Diagnosis not present

## 2024-08-21 DIAGNOSIS — E1165 Type 2 diabetes mellitus with hyperglycemia: Secondary | ICD-10-CM | POA: Diagnosis not present

## 2024-08-24 DIAGNOSIS — M545 Low back pain, unspecified: Secondary | ICD-10-CM | POA: Diagnosis not present

## 2024-09-02 DIAGNOSIS — M545 Low back pain, unspecified: Secondary | ICD-10-CM | POA: Diagnosis not present

## 2024-09-30 DIAGNOSIS — M5416 Radiculopathy, lumbar region: Secondary | ICD-10-CM | POA: Diagnosis not present
